# Patient Record
Sex: Female | Born: 1990 | Race: White | Hispanic: No | Smoking: Never smoker
Health system: Southern US, Community
[De-identification: ages and names within clinical notes are randomized; demographics above are authoritative.]

## PROBLEM LIST (undated history)

## (undated) ENCOUNTER — Emergency Department (HOSPITAL_COMMUNITY): Admission: EM | Payer: Medicaid Other | Source: Home / Self Care

## (undated) DIAGNOSIS — T22022A Burn of unspecified degree of left elbow, initial encounter: Secondary | ICD-10-CM

## (undated) DIAGNOSIS — R112 Nausea with vomiting, unspecified: Secondary | ICD-10-CM

## (undated) DIAGNOSIS — G43909 Migraine, unspecified, not intractable, without status migrainosus: Secondary | ICD-10-CM

## (undated) DIAGNOSIS — IMO0001 Reserved for inherently not codable concepts without codable children: Secondary | ICD-10-CM

## (undated) DIAGNOSIS — M26629 Arthralgia of temporomandibular joint, unspecified side: Secondary | ICD-10-CM

## (undated) DIAGNOSIS — Z8614 Personal history of Methicillin resistant Staphylococcus aureus infection: Secondary | ICD-10-CM

## (undated) DIAGNOSIS — D509 Iron deficiency anemia, unspecified: Secondary | ICD-10-CM

## (undated) DIAGNOSIS — Z9889 Other specified postprocedural states: Secondary | ICD-10-CM

## (undated) DIAGNOSIS — J302 Other seasonal allergic rhinitis: Secondary | ICD-10-CM

## (undated) DIAGNOSIS — K115 Sialolithiasis: Secondary | ICD-10-CM

## (undated) HISTORY — PX: TONSILLECTOMY AND ADENOIDECTOMY: SHX28

## (undated) HISTORY — DX: Other seasonal allergic rhinitis: J30.2

---
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
## 2011-03-14 LAB — POCT I-STAT, CHEM 8
Calcium, Ion: 1.16 mmol/L (ref 1.12–1.32)
Glucose, Bld: 102 mg/dL — ABNORMAL HIGH (ref 70–99)
Potassium: 3.3 mEq/L — ABNORMAL LOW (ref 3.5–5.1)
Sodium: 138 mEq/L (ref 135–145)

## 2011-03-15 LAB — STREP A DNA PROBE

## 2011-10-28 HISTORY — PX: WISDOM TOOTH EXTRACTION: SHX21

## 2011-11-04 ENCOUNTER — Encounter (INDEPENDENT_AMBULATORY_CARE_PROVIDER_SITE_OTHER): Payer: Self-pay | Admitting: General Surgery

## 2011-11-05 ENCOUNTER — Ambulatory Visit (INDEPENDENT_AMBULATORY_CARE_PROVIDER_SITE_OTHER): Payer: PRIVATE HEALTH INSURANCE | Admitting: General Surgery

## 2011-11-05 ENCOUNTER — Encounter (INDEPENDENT_AMBULATORY_CARE_PROVIDER_SITE_OTHER): Payer: Self-pay | Admitting: General Surgery

## 2011-11-05 VITALS — BP 101/63 | HR 84 | Temp 98.9°F | Resp 18 | Ht 67.0 in | Wt 160.4 lb

## 2011-11-05 DIAGNOSIS — L723 Sebaceous cyst: Secondary | ICD-10-CM

## 2011-11-05 NOTE — Patient Instructions (Signed)
Stop taking the antibiotic  Epidermal Cyst An epidermal cyst is usually a small, painless lump under the skin. Cysts often occur on the face, neck, stomach, chest, or genitals. The cyst may be filled with a bad smelling paste. Do not pop your cyst. Popping the cyst can cause pain and puffiness (swelling). HOME CARE   Only take medicines as told by your doctor.   Take your medicine (antibiotics) as told. Finish it even if you start to feel better.  GET HELP RIGHT AWAY IF:  Your cyst is tender, red, or puffy.   You are not getting better, or you are getting worse.   You have any questions or concerns.  MAKE SURE YOU:  Understand these instructions.   Will watch your condition.   Will get help right away if you are not doing well or get worse.  Document Released: 01/20/2005 Document Revised: 08/25/2011 Document Reviewed: 06/21/2011 [facility] Patient Information 2012 [place], [place].

## 2011-11-05 NOTE — Progress Notes (Signed)
Chief Complaint  Patient presents with  . Cyst    HPI Sydney [name] is a 20 y.o. female.   HPI  20 year old Caucasian female referred by her dermatologist for evaluation of a left lower quadrant abdominal wall cyst. The patient states that it initially presented about a year ago as a very small hard bump underneath the skin. About a month ago, it increased in size to the size of a grape. it became red, tender, and inflamed. She saw her dermatologist on 11 October where a small incision and drainage was attempted. Apparently it was around 4 cm in size at that time. Very little was expressed after incision and drainage. The patient was placed on doxycycline.  The patient comes in today without any complaints. She denies any pain in the area. She denies any drainage or redness from the incision. She denies any other lumps or bumps. She denies any fevers or chills. She denies any night sweats or weight change. There is no family history of cancer.  Past Medical History  Diagnosis Date  . Asthma   . Seasonal allergies   . Eczema     Past Surgical History  Procedure Date  . Tonsillectomy     No family history on file.  Social History History  Substance Use Topics  . Smoking status: Never Smoker   . Smokeless tobacco: Not on file  . Alcohol Use: No    No Known Allergies  Current Outpatient Prescriptions  Medication Sig Dispense Refill  . ampicillin (PRINCIPEN) 500 MG capsule Take 500 mg by mouth 4 (four) times daily.        . clindamycin-benzoyl peroxide (BENZACLIN) gel Apply topically 2 (two) times daily.        . montelukast (SINGULAIR) 10 MG tablet Take 10 mg by mouth at bedtime.        . TRETINOIN EX Apply topically.          Review of Systems Review of Systems  Constitutional: Negative for fever, chills, fatigue and unexpected weight change.  HENT: Positive for sore throat. Negative for hearing loss, congestion, trouble swallowing and voice change.   Eyes: Negative  for visual disturbance.  Respiratory: Negative for cough, wheezing and stridor.   Cardiovascular: Negative for chest pain, palpitations and leg swelling.  Gastrointestinal: Negative for nausea, vomiting, abdominal pain, diarrhea, constipation, blood in stool, abdominal distention and anal bleeding.  Genitourinary: Negative for hematuria, vaginal bleeding and difficulty urinating.  Musculoskeletal: Negative for arthralgias.  Skin: Negative for rash and wound.  Neurological: Negative for seizures, syncope and headaches.  Hematological: Negative for adenopathy. Does not bruise/bleed easily.  Psychiatric/Behavioral: Negative for confusion.    Blood pressure 101/63, pulse 84, temperature 98.9 F (37.2 C), temperature source Temporal, resp. rate 18, height 5\' 7"  (1.702 m), weight 160 lb 6.4 oz (72.757 kg).  Physical Exam Physical Exam  Vitals reviewed. Constitutional: She is oriented to person, place, and time. She appears well-developed and well-nourished. No distress.  HENT:  Head: Normocephalic and atraumatic.  Eyes: Conjunctivae are normal. No scleral icterus.  Neck: Normal range of motion. Neck supple. No tracheal deviation present. No thyromegaly present.  Cardiovascular: Normal rate, regular rhythm and normal heart sounds.   Pulmonary/Chest: Effort normal and breath sounds normal. No stridor. No respiratory distress. She has no wheezes.  Abdominal: Soft. Bowel sounds are normal. She exhibits no distension and no mass. There is no tenderness.         Left lower quad abd wall -  about 2 in above ASIS. Well healed 0.5cm incision. No overlying cellulitis, fluctuance. About 1-2 mm of subcu tissue thickness under incision.  No pore.  Musculoskeletal: Normal range of motion. She exhibits no edema.  Lymphadenopathy:    She has no cervical adenopathy.       Right: No inguinal and no supraclavicular adenopathy present.       Left: No inguinal and no supraclavicular adenopathy present.    Neurological: She is alert and oriented to person, place, and time. She exhibits normal muscle tone.  Skin: Skin is warm and dry.  Psychiatric: She has a normal mood and affect. Her behavior is normal. Judgment and thought content normal.    Data Reviewed Dr Nino Parsley note from 10/07/11  Assessment    Left lower abdominal wall sebaceous cyst    Plan    Stop taking the antibiotic  I think this most likely was a small sebaceous cyst. There are no signs of infection currently.  We discussed sebaceous cyst. The patient was given educational material. We discussed observation versus operative excision. We discussed the risk and benefits of excision. We also discussed the pros and cons of observation. Since it is very small and not bothering the patient, I recommended observation area. If the area becomes red or swollen again, I advised the patient to contact our office.  Followup p.r.n.  Mary Sella. Andrey Campanile, MD, FACS General, Bariatric, & Minimally Invasive Surgery [facility] Surgery, [place]        [facility] M 11/05/2011, 4:44 PM

## 2012-02-18 ENCOUNTER — Encounter (INDEPENDENT_AMBULATORY_CARE_PROVIDER_SITE_OTHER): Payer: PRIVATE HEALTH INSURANCE | Admitting: General Surgery

## 2012-04-07 ENCOUNTER — Encounter (INDEPENDENT_AMBULATORY_CARE_PROVIDER_SITE_OTHER): Payer: Self-pay | Admitting: General Surgery

## 2012-04-07 ENCOUNTER — Ambulatory Visit (INDEPENDENT_AMBULATORY_CARE_PROVIDER_SITE_OTHER): Payer: PRIVATE HEALTH INSURANCE | Admitting: General Surgery

## 2012-04-07 VITALS — BP 124/80 | HR 84 | Resp 16 | Ht 67.0 in | Wt 162.0 lb

## 2012-04-07 DIAGNOSIS — L723 Sebaceous cyst: Secondary | ICD-10-CM | POA: Insufficient documentation

## 2012-04-07 NOTE — Progress Notes (Signed)
Patient ID: Sydney [name], female   DOB: [birth year], 21 y.o.   MRN: [id number]  Chief Complaint  Patient presents with  . Cyst    LLQ abdomen    HPI Sydney [name] is a 21 y.o. female.   HPI 21 year old female comes in for followup regarding her sebaceous cyst on her left lower quadrant abdominal wall. I initially met her on November 05, 2011. At that time it was not symptomatic and it had been previously incised and drained. She states that the area is still present and it seemed slightly larger and it fills harder to her. She denies any fevers or chills. She denies any weight loss. She denies any other subcutaneous masses. She states that it's very tender if it's touched or press on. She is interested in surgical excision Past Medical History  Diagnosis Date  . Asthma   . Seasonal allergies   . Eczema     Past Surgical History  Procedure Date  . Tonsillectomy     No family history on file.  Social History History  Substance Use Topics  . Smoking status: Never Smoker   . Smokeless tobacco: Not on file  . Alcohol Use: No    No Known Allergies  Current Outpatient Prescriptions  Medication Sig Dispense Refill  . ampicillin (PRINCIPEN) 500 MG capsule Take 500 mg by mouth 4 (four) times daily.        . clindamycin-benzoyl peroxide (BENZACLIN) gel Apply topically 2 (two) times daily.        . montelukast (SINGULAIR) 10 MG tablet Take 10 mg by mouth at bedtime.        . TRETINOIN EX Apply topically.          Review of Systems Review of Systems  Constitutional: Negative for fever, chills, appetite change and unexpected weight change.  HENT: Negative for hearing loss and nosebleeds.   Eyes: Negative for photophobia and visual disturbance.  Respiratory: Negative for shortness of breath and wheezing.   Cardiovascular: Negative for chest pain and palpitations.  Gastrointestinal: Negative.   Genitourinary: Negative for dysuria, hematuria, difficulty urinating and menstrual  problem.  Musculoskeletal: Negative.   Neurological: Negative.   Hematological: Negative.   Psychiatric/Behavioral: Negative.     Blood pressure 124/80, pulse 84, resp. rate 16, height 5\' 7"  (1.702 m), weight 162 lb (73.483 kg).  Physical Exam Physical Exam  Vitals reviewed. Constitutional: She is oriented to person, place, and time. She appears well-developed and well-nourished. No distress.  HENT:  Head: Normocephalic and atraumatic.  Right Ear: External ear normal.  Left Ear: External ear normal.  Eyes: Conjunctivae are normal. No scleral icterus.  Neck: Normal range of motion. Neck supple. No tracheal deviation present. No thyromegaly present.  Cardiovascular: Normal rate, regular rhythm and normal heart sounds.   Pulmonary/Chest: Effort normal and breath sounds normal. No respiratory distress. She has no wheezes.  Abdominal: Soft. Bowel sounds are normal. She exhibits no distension. There is no tenderness.         About 2 inches above left ASIS, small scar about 1cm long, just superior to that is a hard well circumscribed nodule in the subcu tissue about the size of large pea. No cellulitis. No induration. No fluctuance.   Musculoskeletal: Normal range of motion. She exhibits no edema and no tenderness.  Neurological: She is alert and oriented to person, place, and time.  Skin: Skin is warm and dry. She is not diaphoretic.  Psychiatric: She has a normal mood and  affect. Her behavior is normal. Judgment and thought content normal.    Data Reviewed My last office note  Assessment    Left lower abdominal wall sebaceous cyst    Plan    This feels like a chronic sebaceous cyst. We discussed ongoing observation versus surgical excision. She is in the surgical excision. We discussed the risks and benefits of surgery including but not limited to bleeding, infection, scarring, recurrence, cosmesis concerns, anesthesia risks, hematoma formation, and the typical postoperative  recovery course.  We will schedule her for surgery to excise the subcutaneous nodule as well as her old incision just inferior to it.  Mary Sella. Andrey Campanile, MD, FACS General, Bariatric, & Minimally Invasive Surgery [facility] Surgery, [place]        [facility] M 04/07/2012, 7:24 PM

## 2012-04-25 ENCOUNTER — Other Ambulatory Visit (INDEPENDENT_AMBULATORY_CARE_PROVIDER_SITE_OTHER): Payer: Self-pay | Admitting: General Surgery

## 2012-04-25 DIAGNOSIS — L723 Sebaceous cyst: Secondary | ICD-10-CM

## 2012-04-25 HISTORY — PX: DERMOID CYST  EXCISION: SHX1452

## 2012-04-26 ENCOUNTER — Telehealth (INDEPENDENT_AMBULATORY_CARE_PROVIDER_SITE_OTHER): Payer: Self-pay | Admitting: General Surgery

## 2012-04-26 NOTE — Telephone Encounter (Signed)
Left message for patient to call back and ask for me 

## 2012-04-26 NOTE — Telephone Encounter (Signed)
Message copied by Liliana Cline on Wed Apr 26, 2012  2:22 PM ------      Message from: Andrey Campanile, ERIC M      Created: Wed Apr 26, 2012  1:49 PM       pls call pt with path report - benign cyst

## 2012-05-23 ENCOUNTER — Encounter (INDEPENDENT_AMBULATORY_CARE_PROVIDER_SITE_OTHER): Payer: Self-pay | Admitting: General Surgery

## 2012-05-23 ENCOUNTER — Ambulatory Visit (INDEPENDENT_AMBULATORY_CARE_PROVIDER_SITE_OTHER): Payer: PRIVATE HEALTH INSURANCE | Admitting: General Surgery

## 2012-05-23 VITALS — BP 104/78 | HR 80 | Temp 98.9°F | Resp 14 | Ht 67.0 in | Wt 158.2 lb

## 2012-05-23 DIAGNOSIS — Z09 Encounter for follow-up examination after completed treatment for conditions other than malignant neoplasm: Secondary | ICD-10-CM

## 2012-05-23 NOTE — Progress Notes (Signed)
Chief complaint: Postop  Procedure: Excision of left lower quadrant abdominal wall subcutaneous mass April 25, 2012  History of Present Ilness: 21 year old Caucasian female comes in today for her postoperative appointment. She states that she did well after surgery. She states that she did have some soreness and tenderness in the area for about a week after surgery. She denies any fevers or chills. She denies any ongoing pain in the area. She denies any problems with her incision. She reports a good energy level.  Physical Exam: BP 104/78  Pulse 80  Temp(Src) 98.9 F (37.2 C) (Temporal)  Resp 14  Ht 5\' 7"  (1.702 m)  Wt 158 lb 3.2 oz (71.759 kg)  BMI 24.78 kg/m2 Alert, nad abd-soft, nontender, well healed LLQ wall incision. No cellulitis. No seroma. No hematoma  Pathology: Benign epidermoid inclusion cyst  Assessment and Plan: Status post excision of left lower quadrant abdominal wall epidermoid inclusion cyst  We discussed her pathology report. She was given a copy of it. I released her to full activities. I did remind her to keep the scar covered with sunscreen while in the direct sunlight over the next few months. Followup p.r.n.  Mary Sella. Andrey Campanile, MD, FACS General, Bariatric, & Minimally Invasive Surgery [facility] Surgery, [place]

## 2012-05-23 NOTE — Patient Instructions (Signed)
Keep sunscreen over incision for next 5 months when area exposed to sunlight

## 2013-06-20 ENCOUNTER — Ambulatory Visit (INDEPENDENT_AMBULATORY_CARE_PROVIDER_SITE_OTHER): Payer: PRIVATE HEALTH INSURANCE | Admitting: General Surgery

## 2013-06-20 ENCOUNTER — Encounter (INDEPENDENT_AMBULATORY_CARE_PROVIDER_SITE_OTHER): Payer: Self-pay | Admitting: General Surgery

## 2013-06-20 VITALS — BP 108/68 | HR 72 | Temp 98.2°F | Resp 15 | Ht 67.0 in | Wt 167.4 lb

## 2013-06-20 DIAGNOSIS — D179 Benign lipomatous neoplasm, unspecified: Secondary | ICD-10-CM

## 2013-06-20 NOTE — Patient Instructions (Signed)
Call our office when you decide how you would like to proceed. As for [employer]. I would like to get an ultrasound the area to confirm the diagnosis. We will schedule this when you call the office  Lipoma A lipoma is a noncancerous (benign) tumor composed of fat cells. They are usually found under the skin (subcutaneous). A lipoma may occur in any tissue of the body that contains fat. Common areas for lipomas to appear include the back, shoulders, buttocks, and thighs. Lipomas are a very common soft tissue growth. They are soft and grow slowly. Most problems caused by a lipoma depend on where it is growing. DIAGNOSIS  A lipoma can be diagnosed with a physical exam. These tumors rarely become cancerous, but radiographic studies can help determine this for certain. Studies used may include:  Computerized X-ray scans (CT or CAT scan).  Computerized magnetic scans (MRI). TREATMENT  Small lipomas that are not causing problems may be watched. If a lipoma continues to enlarge or causes problems, removal is often the best treatment. Lipomas can also be removed to improve appearance. Surgery is done to remove the fatty cells and the surrounding capsule. Most often, this is done with medicine that numbs the area (local anesthetic). The removed tissue is examined under a microscope to make sure it is not cancerous. Keep all follow-up appointments with your caregiver. SEEK MEDICAL CARE IF:   The lipoma becomes larger or hard.  The lipoma becomes painful, red, or increasingly swollen. These could be signs of infection or a more serious condition. Document Released: 12/03/2002 Document Revised: 03/06/2012 Document Reviewed: 05/15/2010 [facility] Patient Information 2014 [place], [place].

## 2013-06-20 NOTE — Progress Notes (Signed)
Subjective:     Patient ID: Sydney [name], female   DOB: Feb 09, 1991, 22 y.o.   MRN: [id number]  HPI 22 year old Caucasian female comes in with complaints of a right lower back lump. I excised a left lower quadrant abdominal wall sebaceous cyst about a year ago. She states back in April this year she had some pain in her right lower back. She then noticed a lump in the area and made. She saw a physician who thought it might be a muscle strain. The area persisted causing intermittent pain. She saw another physician who diagnosed it as a sebaceous cyst and she came back to see me. She denies any weight loss, night sweats, redness or drainage from the area. Denies any trauma to the area. She states that it feels like it is on her bone. It causes her more discomfort when she stands for prolonged period of time. She denies any numbness or tingling down her lower extremities. She denies any lower extremity weakness. PMHx, PSHx, SOCHx, FAMHx, ALL reviewed and unchanged   Review of Systems 8 point review of systems is performed and all systems are negative except for what is mentioned in the history of present illness    Objective:   Physical Exam  Skin:      BP 108/68  Pulse 72  Temp(Src) 98.2 F (36.8 C) (Temporal)  Resp 15  Ht 5\' 7"  (1.702 m)  Wt 167 lb 6.4 oz (75.932 kg)  BMI 26.21 kg/m2  Gen: alert, NAD, non-toxic appearing Pupils: equal, no scleral icterus Pulm: Lungs clear to auscultation, symmetric chest rise CV: regular rate and rhythm Abd: soft, nondistended.  Ext: no edema, no calf tenderness, strength symmetric Skin: no rash, no jaundice Back: Right lower back - lateral well circumscribed deep soft tissue mass, mobile, about size of large grape. No overlying skin lesion     Assessment:     Right lower back subcutaneous mass     Plan:     On exam this is most consistent with a lipoma. I do not think it is a sebaceous cyst. It does not feel like a muscular tendon either.  We discussed all these as potential diagnoses. However it is most consistent with a lipoma  We discussed the etiology and management of lipomas. The patient was given educational material. We discussed that the majority of lipomas are benign although on a rare occasion it can be malignant.   We discussed observation versus surgical excision. We discussed the risks and benefits of surgery including but not limited to bleeding, infection, injury to surrounding structures, scarring, cosmetic concerns, blood clot formation, anesthesia issues, possible recurrence, and the typical postoperative course.   I did recommend an ultrasound area just to confirm a diagnosis.  The patient and her mother would like to think about it overnight. However they're leaning toward proceeding with intervention with ultrasound and ultimate surgical excision. 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 I explained that we should be able to accommodate her wishes  They were instructed to call the office in the next day or so with how they would like to proceed  Mary Sella. Andrey Campanile, MD, FACS General, Bariatric, & Minimally Invasive Surgery [facility] Surgery, [place]

## 2013-06-26 ENCOUNTER — Other Ambulatory Visit (INDEPENDENT_AMBULATORY_CARE_PROVIDER_SITE_OTHER): Payer: Self-pay

## 2013-06-26 ENCOUNTER — Telehealth (INDEPENDENT_AMBULATORY_CARE_PROVIDER_SITE_OTHER): Payer: Self-pay | Admitting: General Surgery

## 2013-06-26 DIAGNOSIS — R222 Localized swelling, mass and lump, trunk: Secondary | ICD-10-CM

## 2013-06-26 DIAGNOSIS — D171 Benign lipomatous neoplasm of skin and subcutaneous tissue of trunk: Secondary | ICD-10-CM

## 2013-06-26 NOTE — Telephone Encounter (Signed)
Any day but 07/12/13 or 07/13/13

## 2013-06-26 NOTE — Telephone Encounter (Signed)
Patient called back and states she does want to go ahead and schedule this ultrasound. Order placed and given to our referral coordinator. Patient can have this done anytime but 06/12/13 and 06/13/2013. Would like a call back at [phone].

## 2013-06-26 NOTE — Telephone Encounter (Signed)
Called patient to let her know per Dr Tawana Scale office note it did look like the patient needed an ultrasound prior to surgery. I called patient to make her aware. Her phone was breaking up and she stated she would call back.

## 2013-06-26 NOTE — Telephone Encounter (Signed)
Message copied by Liliana Cline on Tue Jun 26, 2013  2:47 PM ------      Message from: Marin Shutter      Created: Tue Jun 26, 2013  1:53 PM      Regarding: Dr. Andrey Campanile      Contact: [phone]       Pt says sx scheduling told her she needs an [place] of her back before sx.  Is that correct, and who would schedule that?  Thx ------

## 2013-06-27 ENCOUNTER — Telehealth (INDEPENDENT_AMBULATORY_CARE_PROVIDER_SITE_OTHER): Payer: Self-pay | Admitting: General Surgery

## 2013-06-27 NOTE — Telephone Encounter (Signed)
Left message for patient to call office for  appt day  at [address],  07/02/13 at 3pm for US pelvis

## 2013-07-02 ENCOUNTER — Ambulatory Visit
Admission: RE | Admit: 2013-07-02 | Discharge: 2013-07-02 | Disposition: A | Discharge status: Home / Self Care | Payer: PRIVATE HEALTH INSURANCE | Source: Ambulatory Visit | Attending: General Surgery | Admitting: General Surgery

## 2013-07-02 DIAGNOSIS — R222 Localized swelling, mass and lump, trunk: Secondary | ICD-10-CM

## 2013-07-03 ENCOUNTER — Telehealth (INDEPENDENT_AMBULATORY_CARE_PROVIDER_SITE_OTHER): Payer: Self-pay | Admitting: General Surgery

## 2013-07-03 NOTE — Telephone Encounter (Signed)
Posting sheet submitted for [facility],. Will fill out orders in AM clinic

## 2013-07-03 NOTE — Telephone Encounter (Signed)
Patient made aware ultrasound did show this area looks like a lipoma. She wants to proceed with removal. Dr Andrey Campanile- please send orders to scheduling.

## 2013-07-03 NOTE — Telephone Encounter (Signed)
Message copied by Liliana Cline on Tue Jul 03, 2013  9:55 AM ------      Message from: Andrey Campanile, ERIC M      Created: Tue Jul 03, 2013  8:34 AM       [place] just shows subcutaneous fat tissue which is what I would suspect given location and depth of lump - area still consistent with a lipoma. Options are to observe or proceed with removal of lump (lipoma) in OR ------

## 2013-07-06 ENCOUNTER — Telehealth (INDEPENDENT_AMBULATORY_CARE_PROVIDER_SITE_OTHER): Payer: Self-pay | Admitting: General Surgery

## 2013-07-06 NOTE — Telephone Encounter (Signed)
Please write [facility] orders sx scheduled for 8/7

## 2013-07-27 HISTORY — PX: LIPOMA EXCISION: SHX5283

## 2013-08-02 ENCOUNTER — Other Ambulatory Visit (INDEPENDENT_AMBULATORY_CARE_PROVIDER_SITE_OTHER): Payer: Self-pay | Admitting: General Surgery

## 2013-08-02 DIAGNOSIS — D1739 Benign lipomatous neoplasm of skin and subcutaneous tissue of other sites: Secondary | ICD-10-CM

## 2013-08-06 ENCOUNTER — Telehealth (INDEPENDENT_AMBULATORY_CARE_PROVIDER_SITE_OTHER): Payer: Self-pay | Admitting: General Surgery

## 2013-08-06 NOTE — Telephone Encounter (Signed)
LMOM 08/06/13@10 :10 to give patient her path results...please see below

## 2013-08-06 NOTE — Telephone Encounter (Signed)
Message copied by June Leap on Mon Aug 06, 2013 10:10 AM ------      Message from: Andrey Campanile, ERIC M      Created: Fri Aug 03, 2013  7:38 PM       Call pt with report - benign lipoma ------

## 2013-08-06 NOTE — Telephone Encounter (Signed)
Per attached note pt advised per Dr Andrey Campanile path result is benign lipoma.

## 2013-08-07 ENCOUNTER — Other Ambulatory Visit (INDEPENDENT_AMBULATORY_CARE_PROVIDER_SITE_OTHER): Payer: Self-pay | Admitting: *Deleted

## 2013-08-07 MED ORDER — OXYCODONE-ACETAMINOPHEN 5-325 MG PO TABS: 1.0000 | ORAL_TABLET | ORAL | Status: DC | PRN

## 2013-08-15 ENCOUNTER — Telehealth (INDEPENDENT_AMBULATORY_CARE_PROVIDER_SITE_OTHER): Payer: Self-pay | Admitting: General Surgery

## 2013-08-15 NOTE — Telephone Encounter (Signed)
LMOM @10 :07 asking patient if she can come in at 3:15 today 8/20 per EW

## 2013-08-15 NOTE — Telephone Encounter (Signed)
Patient cannot come in today due to school schedule. She gets out of class at 3 but it takes 45 minutes to get here. I do not see any other openings. Please call back to schedule.

## 2013-08-15 NOTE — Telephone Encounter (Signed)
Spoke with patient and made appt for 8/28@4 :00.Marland KitchenMarland Kitchenpatient doing well and is agreeable with POC at this time

## 2013-08-23 ENCOUNTER — Ambulatory Visit (INDEPENDENT_AMBULATORY_CARE_PROVIDER_SITE_OTHER): Payer: PRIVATE HEALTH INSURANCE | Admitting: General Surgery

## 2013-08-23 ENCOUNTER — Encounter (INDEPENDENT_AMBULATORY_CARE_PROVIDER_SITE_OTHER): Payer: Self-pay | Admitting: General Surgery

## 2013-08-23 VITALS — BP 124/82 | HR 88 | Temp 98.8°F | Resp 14 | Ht 67.0 in | Wt 174.2 lb

## 2013-08-23 DIAGNOSIS — Z09 Encounter for follow-up examination after completed treatment for conditions other than malignant neoplasm: Secondary | ICD-10-CM

## 2013-08-23 NOTE — Progress Notes (Signed)
Subjective:     Patient ID: Sydney [name], female   DOB: 03/21/1991, 22 y.o.   MRN: [id number]  HPI 22yo WF s/p excision of right lower back lipoma comes in for f/u. No complaints. No fever/chills. No drainage. no pain  Review of Systems     Objective:   Physical Exam BP 124/82  Pulse 88  Temp(Src) 98.8 F (37.1 C) (Temporal)  Resp 14  Ht 5\' 7"  (1.702 m)  Wt 174 lb 3.2 oz (79.017 kg)  BMI 27.28 kg/m2 Alert, no apparent distress Back as well healed right lower back transverse incision. No cellulitis, induration or fluctuance. No hematoma or seroma    Assessment:     Status post excision of right lower back lipoma     Plan:     Overall I think she is doing well. The incision looks really good. She was given a which showed a lipoma. Followup when necessary  Mary Sella. Andrey Campanile, MD, FACS General, Bariatric, & Minimally Invasive Surgery [facility] Surgery, [place]

## 2013-09-26 DIAGNOSIS — K115 Sialolithiasis: Secondary | ICD-10-CM

## 2013-09-26 HISTORY — DX: Sialolithiasis: K11.5

## 2013-10-18 ENCOUNTER — Encounter (HOSPITAL_BASED_OUTPATIENT_CLINIC_OR_DEPARTMENT_OTHER): Payer: Self-pay | Admitting: *Deleted

## 2013-10-18 DIAGNOSIS — T22022A Burn of unspecified degree of left elbow, initial encounter: Secondary | ICD-10-CM

## 2013-10-18 DIAGNOSIS — IMO0001 Reserved for inherently not codable concepts without codable children: Secondary | ICD-10-CM

## 2013-10-18 HISTORY — DX: Burn of unspecified degree of left elbow, initial encounter: T22.022A

## 2013-10-18 HISTORY — DX: Reserved for inherently not codable concepts without codable children: IMO0001

## 2013-10-23 NOTE — H&P (Signed)
  This is a 22 y/o wd/wn white female who presents with pain in the left floor of the mouth and left submandibular area. This gets worse when eating.  It has become more painful the last several weeks.  There is slight swelling in the left floor of the mouth and no discharge from the left whartons duct.  The treatment plan is to remove the blockage (stone)

## 2013-10-24 ENCOUNTER — Ambulatory Visit (HOSPITAL_BASED_OUTPATIENT_CLINIC_OR_DEPARTMENT_OTHER)
Admission: RE | Admit: 2013-10-24 | Discharge: 2013-10-24 | Disposition: A | Discharge status: Home / Self Care | Payer: No Typology Code available for payment source | Source: Ambulatory Visit | Attending: Oral Surgery | Admitting: Oral Surgery

## 2013-10-24 ENCOUNTER — Encounter (HOSPITAL_BASED_OUTPATIENT_CLINIC_OR_DEPARTMENT_OTHER)
Admission: RE | Disposition: A | Discharge status: Home / Self Care | Payer: Self-pay | Source: Ambulatory Visit | Attending: Oral Surgery

## 2013-10-24 ENCOUNTER — Encounter (HOSPITAL_BASED_OUTPATIENT_CLINIC_OR_DEPARTMENT_OTHER): Payer: No Typology Code available for payment source | Admitting: *Deleted

## 2013-10-24 ENCOUNTER — Encounter (HOSPITAL_BASED_OUTPATIENT_CLINIC_OR_DEPARTMENT_OTHER): Payer: Self-pay | Admitting: *Deleted

## 2013-10-24 ENCOUNTER — Ambulatory Visit (HOSPITAL_BASED_OUTPATIENT_CLINIC_OR_DEPARTMENT_OTHER): Payer: No Typology Code available for payment source | Admitting: *Deleted

## 2013-10-24 DIAGNOSIS — Z01812 Encounter for preprocedural laboratory examination: Secondary | ICD-10-CM | POA: Insufficient documentation

## 2013-10-24 DIAGNOSIS — J309 Allergic rhinitis, unspecified: Secondary | ICD-10-CM | POA: Insufficient documentation

## 2013-10-24 DIAGNOSIS — J45909 Unspecified asthma, uncomplicated: Secondary | ICD-10-CM | POA: Insufficient documentation

## 2013-10-24 DIAGNOSIS — R599 Enlarged lymph nodes, unspecified: Secondary | ICD-10-CM | POA: Insufficient documentation

## 2013-10-24 DIAGNOSIS — Z79899 Other long term (current) drug therapy: Secondary | ICD-10-CM | POA: Insufficient documentation

## 2013-10-24 DIAGNOSIS — K115 Sialolithiasis: Secondary | ICD-10-CM | POA: Insufficient documentation

## 2013-10-24 HISTORY — DX: Migraine, unspecified, not intractable, without status migrainosus: G43.909

## 2013-10-24 HISTORY — PX: SUBLINGUAL SALIVARY CYST EXCISION: SHX2454

## 2013-10-24 HISTORY — DX: Personal history of Methicillin resistant Staphylococcus aureus infection: Z86.14

## 2013-10-24 HISTORY — DX: Other specified postprocedural states: Z98.890

## 2013-10-24 HISTORY — DX: Other specified postprocedural states: R11.2

## 2013-10-24 HISTORY — DX: Reserved for inherently not codable concepts without codable children: IMO0001

## 2013-10-24 HISTORY — DX: Sialolithiasis: K11.5

## 2013-10-24 HISTORY — DX: Burn of unspecified degree of left elbow, initial encounter: T22.022A

## 2013-10-24 HISTORY — DX: Arthralgia of temporomandibular joint, unspecified side: M26.629

## 2013-10-24 SURGERY — EXCISION, CYST, SUBLINGUAL GLAND
Anesthesia: General | Site: Mouth | Laterality: Left | Wound class: Clean Contaminated

## 2013-10-24 MED ORDER — MIDAZOLAM HCL 2 MG/2ML IJ SOLN
INTRAMUSCULAR | Status: AC
  Filled 2013-10-24: qty 2

## 2013-10-24 MED ORDER — HYDROMORPHONE HCL PF 1 MG/ML IJ SOLN
0.2500 mg | INTRAMUSCULAR | Status: DC | PRN
  Administered 2013-10-24 (×4): 0.5 mg via INTRAVENOUS
  Administered 2013-10-24: 0.25 mg via INTRAVENOUS

## 2013-10-24 MED ORDER — LIDOCAINE HCL (CARDIAC) 20 MG/ML IV SOLN
INTRAVENOUS | Status: DC | PRN
  Administered 2013-10-24: 80 mg via INTRAVENOUS

## 2013-10-24 MED ORDER — HYDROMORPHONE HCL PF 1 MG/ML IJ SOLN
INTRAMUSCULAR | Status: AC
  Filled 2013-10-24: qty 1

## 2013-10-24 MED ORDER — PROPOFOL 10 MG/ML IV BOLUS
INTRAVENOUS | Status: DC | PRN
  Administered 2013-10-24: 200 mg via INTRAVENOUS

## 2013-10-24 MED ORDER — PROMETHAZINE HCL 25 MG/ML IJ SOLN
6.2500 mg | INTRAMUSCULAR | Status: DC | PRN
  Administered 2013-10-24: 6.25 mg via INTRAVENOUS

## 2013-10-24 MED ORDER — FENTANYL CITRATE 0.05 MG/ML IJ SOLN: 50.0000 ug | INTRAMUSCULAR | Status: DC | PRN

## 2013-10-24 MED ORDER — HYDROCODONE-ACETAMINOPHEN 7.5-325 MG PO TABS: 1.0000 | ORAL_TABLET | Freq: Four times a day (QID) | ORAL | Status: DC | PRN

## 2013-10-24 MED ORDER — OXYMETAZOLINE HCL 0.05 % NA SOLN
NASAL | Status: DC | PRN
  Administered 2013-10-24 (×2): 2 via NASAL

## 2013-10-24 MED ORDER — SCOPOLAMINE 1 MG/3DAYS TD PT72
MEDICATED_PATCH | TRANSDERMAL | Status: AC
  Filled 2013-10-24: qty 1

## 2013-10-24 MED ORDER — OXYCODONE HCL 5 MG/5ML PO SOLN
5.0000 mg | Freq: Once | ORAL | Status: AC | PRN
  Administered 2013-10-24: 5 mg via ORAL

## 2013-10-24 MED ORDER — OXYCODONE HCL 5 MG PO TABS: 5.0000 mg | ORAL_TABLET | Freq: Once | ORAL | Status: AC | PRN

## 2013-10-24 MED ORDER — MIDAZOLAM HCL 2 MG/ML PO SYRP: 12.0000 mg | ORAL_SOLUTION | Freq: Once | ORAL | Status: DC | PRN

## 2013-10-24 MED ORDER — ONDANSETRON HCL 4 MG/2ML IJ SOLN
INTRAMUSCULAR | Status: DC | PRN
  Administered 2013-10-24: 4 mg via INTRAVENOUS

## 2013-10-24 MED ORDER — MIDAZOLAM HCL 2 MG/2ML IJ SOLN: 1.0000 mg | INTRAMUSCULAR | Status: DC | PRN

## 2013-10-24 MED ORDER — BUPIVACAINE-EPINEPHRINE PF 0.5-1:200000 % IJ SOLN
INTRAMUSCULAR | Status: AC
  Filled 2013-10-24: qty 30

## 2013-10-24 MED ORDER — CEFAZOLIN SODIUM-DEXTROSE 2-3 GM-% IV SOLR
2.0000 g | INTRAVENOUS | Status: AC
  Administered 2013-10-24: 2 g via INTRAVENOUS

## 2013-10-24 MED ORDER — FENTANYL CITRATE 0.05 MG/ML IJ SOLN
INTRAMUSCULAR | Status: AC
  Filled 2013-10-24: qty 4

## 2013-10-24 MED ORDER — DEXAMETHASONE SODIUM PHOSPHATE 4 MG/ML IJ SOLN
INTRAMUSCULAR | Status: DC | PRN
  Administered 2013-10-24: 10 mg via INTRAVENOUS

## 2013-10-24 MED ORDER — SUCCINYLCHOLINE CHLORIDE 20 MG/ML IJ SOLN
INTRAMUSCULAR | Status: DC | PRN
  Administered 2013-10-24: 100 mg via INTRAVENOUS

## 2013-10-24 MED ORDER — LACTATED RINGERS IV SOLN
INTRAVENOUS | Status: DC
  Administered 2013-10-24 (×2): via INTRAVENOUS

## 2013-10-24 MED ORDER — OXYCODONE HCL 5 MG/5ML PO SOLN
ORAL | Status: AC
  Filled 2013-10-24: qty 5

## 2013-10-24 MED ORDER — BACITRACIN-NEOMYCIN-POLYMYXIN 400-5-5000 EX OINT
TOPICAL_OINTMENT | CUTANEOUS | Status: AC
  Filled 2013-10-24: qty 1

## 2013-10-24 MED ORDER — MIDAZOLAM HCL 5 MG/5ML IJ SOLN
INTRAMUSCULAR | Status: DC | PRN
  Administered 2013-10-24: 2 mg via INTRAVENOUS

## 2013-10-24 MED ORDER — SCOPOLAMINE 1 MG/3DAYS TD PT72: 1.0000 | MEDICATED_PATCH | TRANSDERMAL | Status: DC

## 2013-10-24 MED ORDER — SUCCINYLCHOLINE CHLORIDE 20 MG/ML IJ SOLN
INTRAMUSCULAR | Status: AC
  Filled 2013-10-24: qty 2

## 2013-10-24 MED ORDER — HYDROMORPHONE HCL PF 1 MG/ML IJ SOLN
0.5000 mg | INTRAMUSCULAR | Status: AC | PRN
  Administered 2013-10-24: 0.5 mg via INTRAVENOUS
  Administered 2013-10-24 (×3): 0.25 mg via INTRAVENOUS

## 2013-10-24 MED ORDER — ONDANSETRON HCL 4 MG/2ML IJ SOLN: 4.0000 mg | Freq: Once | INTRAMUSCULAR | Status: DC | PRN

## 2013-10-24 MED ORDER — PROPOFOL 10 MG/ML IV EMUL
INTRAVENOUS | Status: AC
  Filled 2013-10-24: qty 100

## 2013-10-24 MED ORDER — SODIUM CHLORIDE 0.9 % IJ SOLN
INTRAMUSCULAR | Status: AC
  Filled 2013-10-24: qty 10

## 2013-10-24 MED ORDER — LIDOCAINE-EPINEPHRINE 2 %-1:100000 IJ SOLN
INTRAMUSCULAR | Status: DC | PRN
  Administered 2013-10-24: 1.5 mL

## 2013-10-24 MED ORDER — FENTANYL CITRATE 0.05 MG/ML IJ SOLN
INTRAMUSCULAR | Status: DC | PRN
  Administered 2013-10-24 (×2): 50 ug via INTRAVENOUS

## 2013-10-24 MED ORDER — LIDOCAINE-EPINEPHRINE 2 %-1:100000 IJ SOLN
INTRAMUSCULAR | Status: AC
  Filled 2013-10-24: qty 3.4

## 2013-10-24 MED ORDER — PROMETHAZINE HCL 25 MG/ML IJ SOLN
INTRAMUSCULAR | Status: AC
  Filled 2013-10-24: qty 1

## 2013-10-24 MED ORDER — SCOPOLAMINE 1 MG/3DAYS TD PT72
1.0000 | MEDICATED_PATCH | Freq: Once | TRANSDERMAL | Status: DC
  Administered 2013-10-24: 1.5 mg via TRANSDERMAL

## 2013-10-24 SURGICAL SUPPLY — 26 items
BLADE SURG 15 STRL LF DISP TIS (BLADE) ×1 IMPLANT
BLADE SURG 15 STRL SS (BLADE) ×1
CANISTER SUCT 1200ML W/VALVE (MISCELLANEOUS) ×2 IMPLANT
CATH ROBINSON RED A/P 10FR (CATHETERS) IMPLANT
COVER MAYO STAND STRL (DRAPES) ×2 IMPLANT
COVER TABLE BACK 60X90 (DRAPES) ×2 IMPLANT
DRAPE U-SHAPE 76X120 STRL (DRAPES) ×2 IMPLANT
GAUZE PACKING IODOFORM 1/4X5 (PACKING) IMPLANT
GLOVE BIO SURGEON STRL SZ 6.5 (GLOVE) ×4 IMPLANT
GLOVE BIO SURGEON STRL SZ7.5 (GLOVE) ×2 IMPLANT
GOWN PREVENTION PLUS XLARGE (GOWN DISPOSABLE) ×2 IMPLANT
GOWN PREVENTION PLUS XXLARGE (GOWN DISPOSABLE) ×2 IMPLANT
NEEDLE DENTAL 27 LONG (NEEDLE) ×2 IMPLANT
NS IRRIG 1000ML POUR BTL (IV SOLUTION) ×2 IMPLANT
PACK BASIN DAY SURGERY FS (CUSTOM PROCEDURE TRAY) ×2 IMPLANT
SUT CHROMIC 3 0 PS 2 (SUTURE) IMPLANT
SUT CHROMIC 4 0 P 3 18 (SUTURE) IMPLANT
SUT SILK 3 0 PS 1 (SUTURE) IMPLANT
SUT VICRYL 4-0 PS2 18IN ABS (SUTURE) IMPLANT
SYR 50ML LL SCALE MARK (SYRINGE) IMPLANT
TOOTHBRUSH ADULT (PERSONAL CARE ITEMS) IMPLANT
TOWEL OR 17X24 6PK STRL BLUE (TOWEL DISPOSABLE) ×4 IMPLANT
TOWEL OR NON WOVEN STRL DISP B (DISPOSABLE) ×2 IMPLANT
TRAY DSU PREP LF (CUSTOM PROCEDURE TRAY) ×2 IMPLANT
TUBE CONNECTING 20X1/4 (TUBING) ×2 IMPLANT
YANKAUER SUCT BULB TIP NO VENT (SUCTIONS) ×2 IMPLANT

## 2013-10-24 NOTE — H&P (Signed)
Sydney [name] is an 22 y.o. female.   Chief Complaint: pain swelling left floor of the mouth HPI: pain and swelling times 1 month  Past Medical History  Diagnosis Date  . Seasonal allergies     nasal congestion and sore throat 10/18/2013  . PONV (postoperative nausea and vomiting)   . Migraine headache   . Asthma     prn inhaler  . Salivary gland stone 09/2013    left  . Burn of left elbow 10/18/2013  . Cut 10/18/2013    right lower leg - no sutures  . History of MRSA infection age 9    thigh  . TMJ tenderness     with opening mouth wide    Past Surgical History  Procedure Laterality Date  . Dermoid cyst  excision Left 04/25/12    abd. wall  . Lipoma excision  07/2013    back  . Tonsillectomy and adenoidectomy    . Wisdom tooth extraction  10/2011    History reviewed. No pertinent family history. Social History:  reports that she has never smoked. She has never used smokeless tobacco. She reports that she does not drink alcohol or use illicit drugs.  Allergies: No Known Allergies  Medications Prior to Admission  Medication Sig Dispense Refill  . fluticasone (FLONASE) 50 MCG/ACT nasal spray Place 2 sprays into the nose daily.      . Levonorgestrel-Ethinyl Estrad (AVIANE PO) Take 1 tablet by mouth daily.       . vitamin B-12 (CYANOCOBALAMIN) 100 MCG tablet Take 100 mcg by mouth daily.      Marland Kitchen albuterol (PROVENTIL HFA;VENTOLIN HFA) 108 (90 BASE) MCG/ACT inhaler Inhale 2 puffs into the lungs every 6 (six) hours as needed for wheezing.        No results found for this or any previous visit (from the past 48 hour(s)). No results found.  Review of Systems  Constitutional: Negative.   HENT: Negative.   Eyes: Negative.   Respiratory: Negative.   Cardiovascular: Negative.   Gastrointestinal: Negative.   Genitourinary: Negative.   Musculoskeletal: Negative.   Skin: Negative.   Neurological: Negative.   Endo/Heme/Allergies: Negative.   Psychiatric/Behavioral:  Negative.   All other systems reviewed and are negative.    Blood pressure 112/75, pulse 72, temperature 98 F (36.7 C), resp. rate 20, height 5\' 7"  (1.702 m), weight 81.103 kg (178 lb 12.8 oz), last menstrual period 09/27/2013, SpO2 98.00%. Physical Exam  Nursing note and vitals reviewed. Constitutional: She is oriented to person, place, and time. She appears well-developed and well-nourished.  HENT:  Head: Normocephalic and atraumatic.  Right Ear: External ear normal.  Left Ear: External ear normal.  Mouth/Throat: Uvula is midline, oropharynx is clear and moist and mucous membranes are normal. No oropharyngeal exudate.    Eyes: Conjunctivae and EOM are normal. Pupils are equal, round, and reactive to light.  Neck: Normal range of motion. Neck supple.  Cardiovascular: Normal rate, regular rhythm, normal heart sounds and intact distal pulses.   Respiratory: Effort normal and breath sounds normal.  GI: Soft. Bowel sounds are normal.  Musculoskeletal: Normal range of motion.  Lymphadenopathy:    She has cervical adenopathy.  Neurological: She is alert and oriented to person, place, and time. She has normal reflexes.  Skin: Skin is warm and dry.  Psychiatric: She has a normal mood and affect. Her behavior is normal. Judgment and thought content normal.     Assessment/Plan Exploration and removal of stone floor of  the mouth left side  Andee Chivers,JOSEPH L 10/24/2013, 8:27 AM

## 2013-10-24 NOTE — Anesthesia Postprocedure Evaluation (Signed)
  Anesthesia Post-op Note  Patient: Sydney [name]  Procedure(s) Performed: Procedure(s): REMOVAL OF SALIVARY GLAND STONE LEFT SIDE (Left)  Patient Location: PACU  Anesthesia Type:General  Level of Consciousness: awake, alert  and oriented  Airway and Oxygen Therapy: Patient Spontanous Breathing and Patient connected to face mask oxygen  Post-op Pain: mild  Post-op Assessment: Post-op Vital signs reviewed  Post-op Vital Signs: Reviewed  Complications: No apparent anesthesia complications

## 2013-10-24 NOTE — Anesthesia Preprocedure Evaluation (Signed)
Anesthesia Evaluation  Patient identified by MRN, date of birth, ID band Patient awake    Reviewed: Allergy & Precautions, H&P , NPO status , Patient's Chart, lab work & pertinent test results  History of Anesthesia Complications (+) PONV  Airway Mallampati: I TM Distance: >3 FB Neck ROM: Full    Dental  (+) Teeth Intact and Dental Advisory Given   Pulmonary asthma ,  breath sounds clear to auscultation        Cardiovascular Rhythm:Regular Rate:Normal     Neuro/Psych    GI/Hepatic   Endo/Other    Renal/GU      Musculoskeletal   Abdominal   Peds  Hematology   Anesthesia Other Findings   Reproductive/Obstetrics                           Anesthesia Physical Anesthesia Plan  ASA: II  Anesthesia Plan: General   Post-op Pain Management:    Induction: Intravenous  Airway Management Planned: Oral ETT  Additional Equipment:   Intra-op Plan:   Post-operative Plan: Extubation in OR  Informed Consent: I have reviewed the patients History and Physical, chart, labs and discussed the procedure including the risks, benefits and alternatives for the proposed anesthesia with the patient or authorized representative who has indicated his/her understanding and acceptance.   Dental advisory given  Plan Discussed with: CRNA, Anesthesiologist and Surgeon  Anesthesia Plan Comments:         Anesthesia Quick Evaluation

## 2013-10-24 NOTE — Transfer of Care (Signed)
Immediate Anesthesia Transfer of Care Note  Patient: Sydney [name]  Procedure(s) Performed: Procedure(s): REMOVAL OF SALIVARY GLAND STONE LEFT SIDE (Left)  Patient Location: PACU  Anesthesia Type:General  Level of Consciousness: awake, alert  and oriented  Airway & Oxygen Therapy: Patient Spontanous Breathing and Patient connected to face mask oxygen  Post-op Assessment: Report given to PACU RN, Post -op Vital signs reviewed and stable and Patient moving all extremities  Post vital signs: Reviewed and stable  Complications: No apparent anesthesia complications

## 2013-10-24 NOTE — H&P (Signed)
No change in medical history.  

## 2013-10-24 NOTE — Op Note (Signed)
The patient was brought to the operating room and placed in the supine position and which remained throughout the whole procedure he was intubated via an oral endotracheal tube as she was unable to be intubated via a nasal endotracheal tube. The face and intraoral area were prepped with Betadine scrub and paint. She was then draped in the usual fashion for an oral surgery procedure. The mouth and throat were suctioned out and a moist open 4 x 4 gauze was placed around the endotracheal tube. 1.5 cc of 2% Xylocaine with 1-100,000 epinephrine was infiltrated into the floor of the mouth. A #15 blade made an incision approximately 3 cm in length in the posterior floor of the mouth. The dissection was then used using a curved hemostat to get to the dark work and submandibular gland. Using extraoral digital pressure an intraoral pressure the stone was located. An attempt was made to manipulate it in a forward direction. The stone appeared to break up so that he could no longer be palpable. Fragments were found in the anterior portion Wharton's duct. A small incision was made in the anterior portion of importance to releasing the fragments. Without being able to palpate any large fragment of stone no further dissection took place. The area was irrigated out. The throat was irrigated and the throat pack was removed. Incision was left open. The patient was then extubated and returned to the recovery room in good condition.

## 2013-10-24 NOTE — Anesthesia Procedure Notes (Signed)
Procedure Name: Intubation Date/Time: 10/24/2013 8:42 AM Performed by: Hinton Dyer Pre-anesthesia Checklist: Patient identified, Emergency Drugs available, Suction available and Patient being monitored Patient Re-evaluated:Patient Re-evaluated prior to inductionOxygen Delivery Method: Circle System Utilized Preoxygenation: Pre-oxygenation with 100% oxygen Intubation Type: IV induction Ventilation: Mask ventilation without difficulty Laryngoscope Size: Miller and 2 Grade View: Grade II Tube type: Oral Tube size: 7.0 mm Number of attempts: 2 (unable to advance #6.5 nasal rae tube after nasal prep; # 7.0 OETT advanced easily under direct visualization) Airway Equipment and Method: stylet and oral airway Placement Confirmation: ETT inserted through vocal cords under direct vision,  positive ETCO2 and breath sounds checked- equal and bilateral Secured at: 21 cm Tube secured with: Tape Dental Injury: Teeth and Oropharynx as per pre-operative assessment

## 2013-10-24 NOTE — Brief Op Note (Signed)
10/24/2013  9:11 AM  PATIENT:  Sydney [name]  22 y.o. female  PRE-OPERATIVE DIAGNOSIS:  Salivary gland stone left side  POST-OPERATIVE DIAGNOSIS:  Salivary gland stone left side  PROCEDURE:  Procedure(s): REMOVAL OF SALIVARY GLAND STONE LEFT SIDE (Left)  SURGEON:  Surgeon(s) and Role:    * Hinton Dyer, DDS - Primary  PHYSICIAN ASSISTANT:   ASSISTANTS: Hadassah Pais, Lequita Halt Arledge exploration floor of the mouth with removal of salivary gland stones   ANESTHESIA:   general  EBL:  Total I/O In: 1000 [I.V.:1000] Out: -   BLOOD ADMINISTERED:none  DRAINS: none   LOCAL MEDICATIONS USED:  XYLOCAINE   SPECIMEN:  Source of Specimen:  Floor of the mouth  DISPOSITION OF SPECIMEN:  PATHOLOGY  COUNTS:  YES  TOURNIQUET:  * No tourniquets in log *  DICTATION: .Dragon Dictation  PLAN OF CARE: Discharge to home after PACU  PATIENT DISPOSITION:  PACU - hemodynamically stable.   Delay start of Pharmacological VTE agent (>24hrs) due to surgical blood loss or risk of bleeding: not applicable

## 2013-10-25 ENCOUNTER — Encounter (HOSPITAL_BASED_OUTPATIENT_CLINIC_OR_DEPARTMENT_OTHER): Payer: Self-pay | Admitting: Oral Surgery

## 2014-11-09 ENCOUNTER — Emergency Department (HOSPITAL_COMMUNITY)
Admission: EM | Admit: 2014-11-09 | Discharge: 2014-11-10 | Disposition: A | Discharge status: Home / Self Care | Payer: BC Managed Care – PPO | Attending: Emergency Medicine | Admitting: Emergency Medicine

## 2014-11-09 ENCOUNTER — Encounter (HOSPITAL_COMMUNITY): Payer: Self-pay | Admitting: Emergency Medicine

## 2014-11-09 DIAGNOSIS — J45909 Unspecified asthma, uncomplicated: Secondary | ICD-10-CM | POA: Insufficient documentation

## 2014-11-09 DIAGNOSIS — R10819 Abdominal tenderness, unspecified site: Secondary | ICD-10-CM | POA: Diagnosis not present

## 2014-11-09 DIAGNOSIS — Z79899 Other long term (current) drug therapy: Secondary | ICD-10-CM | POA: Insufficient documentation

## 2014-11-09 DIAGNOSIS — B9689 Other specified bacterial agents as the cause of diseases classified elsewhere: Secondary | ICD-10-CM

## 2014-11-09 DIAGNOSIS — Z872 Personal history of diseases of the skin and subcutaneous tissue: Secondary | ICD-10-CM | POA: Diagnosis not present

## 2014-11-09 DIAGNOSIS — Z7951 Long term (current) use of inhaled steroids: Secondary | ICD-10-CM | POA: Insufficient documentation

## 2014-11-09 DIAGNOSIS — Z8719 Personal history of other diseases of the digestive system: Secondary | ICD-10-CM | POA: Diagnosis not present

## 2014-11-09 DIAGNOSIS — N739 Female pelvic inflammatory disease, unspecified: Secondary | ICD-10-CM | POA: Diagnosis not present

## 2014-11-09 DIAGNOSIS — R109 Unspecified abdominal pain: Secondary | ICD-10-CM | POA: Diagnosis present

## 2014-11-09 DIAGNOSIS — N760 Acute vaginitis: Secondary | ICD-10-CM | POA: Diagnosis not present

## 2014-11-09 DIAGNOSIS — N730 Acute parametritis and pelvic cellulitis: Secondary | ICD-10-CM

## 2014-11-09 LAB — POC URINE PREG, ED: Preg Test, Ur: NEGATIVE

## 2014-11-09 LAB — CBC WITH DIFFERENTIAL/PLATELET
BASOS ABS: 0 10*3/uL (ref 0.0–0.1)
BASOS PCT: 0 % (ref 0–1)
EOS ABS: 0.2 10*3/uL (ref 0.0–0.7)
Eosinophils Relative: 2 % (ref 0–5)
HEMATOCRIT: 35.1 % — AB (ref 36.0–46.0)
HEMOGLOBIN: 12 g/dL (ref 12.0–15.0)
Lymphocytes Relative: 35 % (ref 12–46)
Lymphs Abs: 2.7 10*3/uL (ref 0.7–4.0)
MCH: 30 pg (ref 26.0–34.0)
MCHC: 34.2 g/dL (ref 30.0–36.0)
MCV: 87.8 fL (ref 78.0–100.0)
MONO ABS: 0.5 10*3/uL (ref 0.1–1.0)
Monocytes Relative: 6 % (ref 3–12)
Neutro Abs: 4.4 10*3/uL (ref 1.7–7.7)
Neutrophils Relative %: 57 % (ref 43–77)
Platelets: 266 10*3/uL (ref 150–400)
RBC: 4 MIL/uL (ref 3.87–5.11)
RDW: 12.3 % (ref 11.5–15.5)
WBC: 7.8 10*3/uL (ref 4.0–10.5)

## 2014-11-09 LAB — BASIC METABOLIC PANEL
ANION GAP: 14 (ref 5–15)
BUN: 8 mg/dL (ref 6–23)
CALCIUM: 9.4 mg/dL (ref 8.4–10.5)
CO2: 23 mEq/L (ref 19–32)
CREATININE: 0.85 mg/dL (ref 0.50–1.10)
Chloride: 109 mEq/L (ref 96–112)
Glucose, Bld: 158 mg/dL — ABNORMAL HIGH (ref 70–99)
Potassium: 4.5 mEq/L (ref 3.7–5.3)
Sodium: 146 mEq/L (ref 137–147)

## 2014-11-09 LAB — URINALYSIS, ROUTINE W REFLEX MICROSCOPIC
Bilirubin Urine: NEGATIVE
GLUCOSE, UA: NEGATIVE mg/dL
HGB URINE DIPSTICK: NEGATIVE
KETONES UR: NEGATIVE mg/dL
Leukocytes, UA: NEGATIVE
Nitrite: NEGATIVE
Protein, ur: NEGATIVE mg/dL
Specific Gravity, Urine: 1.01 (ref 1.005–1.030)
Urobilinogen, UA: 0.2 mg/dL (ref 0.0–1.0)
pH: 7 (ref 5.0–8.0)

## 2014-11-09 LAB — LIPASE, BLOOD: Lipase: 58 U/L (ref 11–59)

## 2014-11-09 NOTE — ED Notes (Signed)
Pt arrived to the ED with a complaint of flank and lower back pain.  Pt states she has had symptoms for four days.  Pt states that she has urinary frequency and a foul odor in her urine.

## 2014-11-10 LAB — WET PREP, GENITAL: TRICH WET PREP: NONE SEEN

## 2014-11-10 MED ORDER — AZITHROMYCIN 250 MG PO TABS
2000.0000 mg | ORAL_TABLET | Freq: Once | ORAL | Status: AC
  Administered 2014-11-10: 2000 mg via ORAL
  Filled 2014-11-10: qty 8

## 2014-11-10 MED ORDER — IBUPROFEN 800 MG PO TABS
800.0000 mg | ORAL_TABLET | Freq: Once | ORAL | Status: AC
  Administered 2014-11-10: 800 mg via ORAL
  Filled 2014-11-10: qty 1

## 2014-11-10 MED ORDER — CEFTRIAXONE SODIUM 250 MG IJ SOLR: 250.0000 mg | Freq: Once | INTRAMUSCULAR | Status: DC

## 2014-11-10 MED ORDER — METRONIDAZOLE 500 MG PO TABS
500.0000 mg | ORAL_TABLET | Freq: Once | ORAL | Status: AC
  Administered 2014-11-10: 500 mg via ORAL
  Filled 2014-11-10: qty 1

## 2014-11-10 MED ORDER — DOXYCYCLINE HYCLATE 100 MG PO TABS: 100.0000 mg | ORAL_TABLET | Freq: Two times a day (BID) | ORAL | Status: DC

## 2014-11-10 MED ORDER — DOXYCYCLINE HYCLATE 100 MG PO TABS
100.0000 mg | ORAL_TABLET | Freq: Once | ORAL | Status: AC
  Administered 2014-11-10: 100 mg via ORAL
  Filled 2014-11-10: qty 1

## 2014-11-10 MED ORDER — METRONIDAZOLE 500 MG PO TABS: 500.0000 mg | ORAL_TABLET | Freq: Two times a day (BID) | ORAL | Status: DC

## 2014-11-10 NOTE — ED Provider Notes (Signed)
CSN: [id number]     Arrival date & time 11/09/14  2012 History   First MD Initiated Contact with Patient 11/10/14 [phone]     Chief Complaint  Patient presents with  . Flank Pain     (Consider location/radiation/quality/duration/timing/severity/associated sxs/prior Treatment) HPI  Sydney [name] is a 23 y.o. female with past medical history of asthma, migraines coming in with back pain. Patient states it's been in her bilateral flanks for the last 4 days. She's had intermittent subjective fevers as well. She states she's had to urinate more frequently and at times has not been able to make it to the restroom. She denies any saddle anesthesia. There's been no dysuria or hematuria. She's noted increase in vaginal discharge. He's had no abnormal bleeding. Patient denies any symptoms of cancer, IV drug abuse.  Nothing has made her symptoms better or worse. Patient has no further complaints.  10 Systems reviewed and are negative for acute change except as noted in the HPI.     Past Medical History  Diagnosis Date  . Seasonal allergies     nasal congestion and sore throat 10/18/2013  . PONV (postoperative nausea and vomiting)   . Migraine headache   . Asthma     prn inhaler  . Salivary gland stone 09/2013    left  . Burn of left elbow 10/18/2013  . Cut 10/18/2013    right lower leg - no sutures  . History of MRSA infection age 64    thigh  . TMJ tenderness     with opening mouth wide   Past Surgical History  Procedure Laterality Date  . Dermoid cyst  excision Left 04/25/12    abd. wall  . Lipoma excision  07/2013    back  . Tonsillectomy and adenoidectomy    . Wisdom tooth extraction  10/2011  . Sublingual salivary cyst excision Left 10/24/2013    Procedure: REMOVAL OF SALIVARY GLAND STONE LEFT SIDE;  Surgeon: Ceasar Mons, DDS;  Location: [facility];  Service: Oral Surgery;  Laterality: Left;   History reviewed. No pertinent family history. History  Substance  Use Topics  . Smoking status: Never Smoker   . Smokeless tobacco: Never Used  . Alcohol Use: No   OB History    No data available     Review of Systems    Allergies  Apple and Cherry  Home Medications   Prior to Admission medications   Medication Sig Start Date End Date Taking? Authorizing Provider  albuterol (PROVENTIL HFA;VENTOLIN HFA) 108 (90 BASE) MCG/ACT inhaler Inhale 2 puffs into the lungs every 6 (six) hours as needed for wheezing.    Historical Provider, MD  fluticasone (FLONASE) 50 MCG/ACT nasal spray Place 2 sprays into the nose daily.    Historical Provider, MD  HYDROcodone-acetaminophen (NORCO) 7.5-325 MG per tablet Take 1 tablet by mouth every 6 (six) hours as needed for pain. 10/24/13   Ceasar Mons, DDS  Levonorgestrel-Ethinyl Estrad (AVIANE PO) Take 1 tablet by mouth daily.     Historical Provider, MD  vitamin B-12 (CYANOCOBALAMIN) 100 MCG tablet Take 100 mcg by mouth daily.    Historical Provider, MD   BP 125/72 mmHg  Pulse 64  Temp(Src) 99.5 F (37.5 C) (Oral)  Resp 16  SpO2 100%  LMP 10/27/2014 Physical Exam  Constitutional: She is oriented to person, place, and time. She appears well-developed and well-nourished. No distress.  HENT:  Head: Normocephalic and atraumatic.  Nose: Nose normal.  Mouth/Throat: Oropharynx is clear and moist. No oropharyngeal exudate.  Eyes: Conjunctivae and EOM are normal. Pupils are equal, round, and reactive to light. No scleral icterus.  Neck: Normal range of motion. Neck supple. No JVD present. No tracheal deviation present. No thyromegaly present.  Cardiovascular: Normal rate, regular rhythm and normal heart sounds.  Exam reveals no gallop and no friction rub.   No murmur heard. Pulmonary/Chest: Effort normal and breath sounds normal. No respiratory distress. She has no wheezes. She exhibits no tenderness.  Abdominal: Soft. Bowel sounds are normal. She exhibits no distension and no mass. There is tenderness. There is  no rebound and no guarding.  Bilateral lower quadrant tenderness to palpation.  Genitourinary:  Pelvic exam deferred to PA at patient's request.she does not want a female provider.  Musculoskeletal: Normal range of motion. She exhibits no edema or tenderness.  Lymphadenopathy:    She has no cervical adenopathy.  Neurological: She is alert and oriented to person, place, and time. No cranial nerve deficit. She exhibits normal muscle tone.  5 out of 5 strength times first ovaries. Normal sensation 4 extremities. Normal cerebellar testing and gait testing.  Skin: Skin is warm and dry. No rash noted. No erythema. No pallor.  Nursing note and vitals reviewed.   ED Course  Procedures (including critical care time) Labs Review Labs Reviewed  WET PREP, GENITAL - Abnormal; Notable for the following:    Yeast Wet Prep HPF POC RARE (*)    Clue Cells Wet Prep HPF POC MANY (*)    WBC, Wet Prep HPF POC FEW (*)    All other components within normal limits  CBC WITH DIFFERENTIAL - Abnormal; Notable for the following:    HCT 35.1 (*)    All other components within normal limits  BASIC METABOLIC PANEL - Abnormal; Notable for the following:    Glucose, Bld 158 (*)    All other components within normal limits  GC/CHLAMYDIA PROBE AMP  LIPASE, BLOOD  URINALYSIS, ROUTINE W REFLEX MICROSCOPIC  POC URINE PREG, ED    Imaging Review No results found.   EKG Interpretation None      MDM   Final diagnoses:  None    Patient since emergency department out of concern for abdominal and back pain. She's also had increased vaginal discharge. I have concern for sexual transmitted infection. Currently waiting pelvic exam.  Pelvic exam unremarkable per PA evaluation.  Will treat for PID and BV seen on wet prep due to the patients history of inc vag DC and now back pain with frequency.  I do not believe patient has an emergent cause of her back pain.  She denies red flag symptoms of malignancy, IV drug abuse  at any time, saddle anesthesia, or significant numbness/weakness in the lower extremities.  PAtient can always feel the sensation of when she has to urinate, but does not always make it to the bathroom.  She was advised to have close follow up with a PCP within 3 days to ensure everything is getting better.  She has a family h/o autoimmune disease and she was told she may have MS causing her intermittent neuro symptoms.  Return precautions given, vitals remain within her normal limits and she is safe for DC.    Everlene Balls, MD 11/10/14 [phone]

## 2014-11-10 NOTE — ED Provider Notes (Signed)
Pelvic exam performed by this provider. Patient preferred female provider to perform exam.   Pelvic exam: Negative swelling, erythema, inflammation, lesions, sores, deformities identified to the external genitalia. Negative swelling, erythema, inflammation, lesions, sores, deformities, masses or lesions noted to the vaginal canal. Negative blood in the vaginal vault. Small amount of thick white discharge noted on exam with mild odor. Cervix identified with negative friability. Negative CMT. Discomfort upon palpation to suprapubic and left adnexal region. Negative right adnexal tenderness. Exam chaperoned with nurse  Jamse Mead, PA-C 11/10/14 [id number]  Everlene Balls, MD 11/10/14 1416

## 2014-11-10 NOTE — Discharge Instructions (Signed)
Bacterial Vaginosis Sydney [name], you were seen today for back pain and abdominal pain. Take antibiotics as prescribed. Come back to emergency department immediately if your back pain worsens or if you develop new weakness or numbness. Follow-up with a primary care physician for continued evaluation within 3 days. Bacterial vaginosis is a vaginal infection that occurs when the normal balance of bacteria in the vagina is disrupted. It results from an overgrowth of certain bacteria. This is the most common vaginal infection in women of childbearing age. Treatment is important to prevent complications, especially in pregnant women, as it can cause a premature delivery. CAUSES  Bacterial vaginosis is caused by an increase in harmful bacteria that are normally present in smaller amounts in the vagina. Several different kinds of bacteria can cause bacterial vaginosis. However, the reason that the condition develops is not fully understood. RISK FACTORS Certain activities or behaviors can put you at an increased risk of developing bacterial vaginosis, including:  Having a new sex partner or multiple sex partners.  Douching.  Using an intrauterine device (IUD) for contraception. Women do not get bacterial vaginosis from toilet seats, bedding, swimming pools, or contact with objects around them. SIGNS AND SYMPTOMS  Some women with bacterial vaginosis have no signs or symptoms. Common symptoms include:  Grey vaginal discharge.  A fishlike odor with discharge, especially after sexual intercourse.  Itching or burning of the vagina and vulva.  Burning or pain with urination. DIAGNOSIS  Your health care provider will take a medical history and examine the vagina for signs of bacterial vaginosis. A sample of vaginal fluid may be taken. Your health care provider will look at this sample under a microscope to check for bacteria and abnormal cells. A vaginal pH test may also be done.  TREATMENT  Bacterial  vaginosis may be treated with antibiotic medicines. These may be given in the form of a pill or a vaginal cream. A second round of antibiotics may be prescribed if the condition comes back after treatment.  HOME CARE INSTRUCTIONS   Only take over-the-counter or prescription medicines as directed by your health care provider.  If antibiotic medicine was prescribed, take it as directed. Make sure you finish it even if you start to feel better.  Do not have sex until treatment is completed.  Tell all sexual partners that you have a vaginal infection. They should see their health care provider and be treated if they have problems, such as a mild rash or itching.  Practice safe sex by using condoms and only having one sex partner. SEEK MEDICAL CARE IF:   Your symptoms are not improving after 3 days of treatment.  You have increased discharge or pain.  You have a fever. MAKE SURE YOU:   Understand these instructions.  Will watch your condition.  Will get help right away if you are not doing well or get worse. FOR MORE INFORMATION  Centers for Disease Control and Prevention, Division of STD Prevention: AppraiserFraud[employer] American Sexual Health Association (ASHA): www.ashastd.org  Document Released: 12/13/2005 Document Revised: 10/03/2013 Document Reviewed: 07/25/2013 [facility] Patient Information 2015 [place], [place]. This information is not intended to replace advice given to you by your health care provider. Make sure you discuss any questions you have with your health care provider.

## 2014-11-11 LAB — GC/CHLAMYDIA PROBE AMP
CT Probe RNA: NEGATIVE
GC Probe RNA: NEGATIVE

## 2015-02-11 LAB — OB RESULTS CONSOLE RPR: RPR: NONREACTIVE

## 2015-02-11 LAB — OB RESULTS CONSOLE ANTIBODY SCREEN: ANTIBODY SCREEN: NEGATIVE

## 2015-02-11 LAB — OB RESULTS CONSOLE HEPATITIS B SURFACE ANTIGEN: HEP B S AG: NEGATIVE

## 2015-02-11 LAB — OB RESULTS CONSOLE RUBELLA ANTIBODY, IGM: RUBELLA: IMMUNE

## 2015-02-11 LAB — OB RESULTS CONSOLE ABO/RH: RH TYPE: POSITIVE

## 2015-02-11 LAB — OB RESULTS CONSOLE GC/CHLAMYDIA
Chlamydia: NEGATIVE
GC PROBE AMP, GENITAL: NEGATIVE

## 2015-02-11 LAB — OB RESULTS CONSOLE HIV ANTIBODY (ROUTINE TESTING): HIV: NONREACTIVE

## 2015-08-20 LAB — OB RESULTS CONSOLE GBS: GBS: NEGATIVE

## 2015-08-28 ENCOUNTER — Inpatient Hospital Stay (HOSPITAL_COMMUNITY)
Admission: AD | Admit: 2015-08-28 | Discharge: 2015-08-29 | Disposition: A | Discharge status: Home / Self Care | Payer: 59 | Source: Ambulatory Visit | Attending: Obstetrics | Admitting: Obstetrics

## 2015-08-28 DIAGNOSIS — O4290 Premature rupture of membranes, unspecified as to length of time between rupture and onset of labor, unspecified weeks of gestation: Secondary | ICD-10-CM

## 2015-08-28 DIAGNOSIS — Z3493 Encounter for supervision of normal pregnancy, unspecified, third trimester: Secondary | ICD-10-CM | POA: Insufficient documentation

## 2015-08-29 ENCOUNTER — Inpatient Hospital Stay (HOSPITAL_COMMUNITY): Payer: 59

## 2015-08-29 ENCOUNTER — Encounter (HOSPITAL_COMMUNITY): Payer: Self-pay | Admitting: *Deleted

## 2015-08-29 DIAGNOSIS — Z3493 Encounter for supervision of normal pregnancy, unspecified, third trimester: Secondary | ICD-10-CM | POA: Diagnosis present

## 2015-08-29 LAB — POCT FERN TEST: POCT FERN TEST: NEGATIVE

## 2015-08-29 LAB — AMNISURE RUPTURE OF MEMBRANE (ROM) NOT AT ARMC: AMNISURE: NEGATIVE

## 2015-08-29 MED ORDER — NON FORMULARY: 150.0000 mg | Freq: Once | Status: DC

## 2015-08-29 MED ORDER — FAMOTIDINE 20 MG PO TABS
20.0000 mg | ORAL_TABLET | Freq: Once | ORAL | Status: AC
  Administered 2015-08-29: 20 mg via ORAL
  Filled 2015-08-29: qty 1

## 2015-08-29 NOTE — Progress Notes (Signed)
U/S results called to [facility], orders received to discharge home

## 2015-08-29 NOTE — Progress Notes (Signed)
Rolitta Dawson CNM notified of pt's arrival and complaints, orders received.

## 2015-08-29 NOTE — Discharge Instructions (Signed)

## 2015-08-29 NOTE — MAU Note (Signed)
Pt presents to MAU with complaints of leakage of fluid that started approximately 45 mins ago. Pt reports lower abdominal pain that comes and goes

## 2015-09-02 ENCOUNTER — Inpatient Hospital Stay (HOSPITAL_COMMUNITY)
Admission: AD | Admit: 2015-09-02 | Discharge: 2015-09-06 | DRG: 765 | Disposition: A | Discharge status: Home / Self Care | Payer: 59 | Source: Ambulatory Visit | Attending: Obstetrics & Gynecology | Admitting: Obstetrics & Gynecology

## 2015-09-02 ENCOUNTER — Encounter (HOSPITAL_COMMUNITY): Payer: Self-pay | Admitting: *Deleted

## 2015-09-02 DIAGNOSIS — O0903 Supervision of pregnancy with history of infertility, third trimester: Secondary | ICD-10-CM | POA: Diagnosis not present

## 2015-09-02 DIAGNOSIS — O1092 Unspecified pre-existing hypertension complicating childbirth: Secondary | ICD-10-CM | POA: Diagnosis present

## 2015-09-02 DIAGNOSIS — Z8349 Family history of other endocrine, nutritional and metabolic diseases: Secondary | ICD-10-CM

## 2015-09-02 DIAGNOSIS — O2603 Excessive weight gain in pregnancy, third trimester: Secondary | ICD-10-CM | POA: Diagnosis present

## 2015-09-02 DIAGNOSIS — Z833 Family history of diabetes mellitus: Secondary | ICD-10-CM | POA: Diagnosis not present

## 2015-09-02 DIAGNOSIS — R112 Nausea with vomiting, unspecified: Secondary | ICD-10-CM | POA: Diagnosis present

## 2015-09-02 DIAGNOSIS — Z841 Family history of disorders of kidney and ureter: Secondary | ICD-10-CM | POA: Diagnosis not present

## 2015-09-02 DIAGNOSIS — O99354 Diseases of the nervous system complicating childbirth: Secondary | ICD-10-CM | POA: Diagnosis present

## 2015-09-02 DIAGNOSIS — R609 Edema, unspecified: Secondary | ICD-10-CM | POA: Diagnosis present

## 2015-09-02 DIAGNOSIS — G43909 Migraine, unspecified, not intractable, without status migrainosus: Secondary | ICD-10-CM | POA: Diagnosis present

## 2015-09-02 DIAGNOSIS — O9952 Diseases of the respiratory system complicating childbirth: Secondary | ICD-10-CM | POA: Diagnosis present

## 2015-09-02 DIAGNOSIS — Z809 Family history of malignant neoplasm, unspecified: Secondary | ICD-10-CM | POA: Diagnosis not present

## 2015-09-02 DIAGNOSIS — O9902 Anemia complicating childbirth: Secondary | ICD-10-CM | POA: Diagnosis present

## 2015-09-02 DIAGNOSIS — Z828 Family history of other disabilities and chronic diseases leading to disablement, not elsewhere classified: Secondary | ICD-10-CM | POA: Diagnosis not present

## 2015-09-02 DIAGNOSIS — O1493 Unspecified pre-eclampsia, third trimester: Secondary | ICD-10-CM | POA: Diagnosis present

## 2015-09-02 DIAGNOSIS — Z810 Family history of intellectual disabilities: Secondary | ICD-10-CM

## 2015-09-02 DIAGNOSIS — O133 Gestational [pregnancy-induced] hypertension without significant proteinuria, third trimester: Secondary | ICD-10-CM | POA: Diagnosis present

## 2015-09-02 DIAGNOSIS — Z811 Family history of alcohol abuse and dependence: Secondary | ICD-10-CM | POA: Diagnosis not present

## 2015-09-02 DIAGNOSIS — D509 Iron deficiency anemia, unspecified: Secondary | ICD-10-CM | POA: Diagnosis present

## 2015-09-02 DIAGNOSIS — Z8614 Personal history of Methicillin resistant Staphylococcus aureus infection: Secondary | ICD-10-CM

## 2015-09-02 DIAGNOSIS — Z821 Family history of blindness and visual loss: Secondary | ICD-10-CM

## 2015-09-02 DIAGNOSIS — Z823 Family history of stroke: Secondary | ICD-10-CM

## 2015-09-02 DIAGNOSIS — Z813 Family history of other psychoactive substance abuse and dependence: Secondary | ICD-10-CM | POA: Diagnosis not present

## 2015-09-02 DIAGNOSIS — Z822 Family history of deafness and hearing loss: Secondary | ICD-10-CM | POA: Diagnosis not present

## 2015-09-02 DIAGNOSIS — O119 Pre-existing hypertension with pre-eclampsia, unspecified trimester: Secondary | ICD-10-CM | POA: Diagnosis present

## 2015-09-02 DIAGNOSIS — J45909 Unspecified asthma, uncomplicated: Secondary | ICD-10-CM | POA: Diagnosis present

## 2015-09-02 DIAGNOSIS — Z8249 Family history of ischemic heart disease and other diseases of the circulatory system: Secondary | ICD-10-CM

## 2015-09-02 DIAGNOSIS — O139 Gestational [pregnancy-induced] hypertension without significant proteinuria, unspecified trimester: Secondary | ICD-10-CM | POA: Diagnosis present

## 2015-09-02 DIAGNOSIS — Z825 Family history of asthma and other chronic lower respiratory diseases: Secondary | ICD-10-CM | POA: Diagnosis not present

## 2015-09-02 DIAGNOSIS — D62 Acute posthemorrhagic anemia: Secondary | ICD-10-CM | POA: Diagnosis not present

## 2015-09-02 DIAGNOSIS — Z3A37 37 weeks gestation of pregnancy: Secondary | ICD-10-CM | POA: Diagnosis present

## 2015-09-02 HISTORY — DX: Iron deficiency anemia, unspecified: D50.9

## 2015-09-02 LAB — COMPREHENSIVE METABOLIC PANEL
ALT: 16 U/L (ref 14–54)
ALT: 17 U/L (ref 14–54)
AST: 25 U/L (ref 15–41)
AST: 34 U/L (ref 15–41)
Albumin: 2.6 g/dL — ABNORMAL LOW (ref 3.5–5.0)
Albumin: 2.8 g/dL — ABNORMAL LOW (ref 3.5–5.0)
Alkaline Phosphatase: 182 U/L — ABNORMAL HIGH (ref 38–126)
Alkaline Phosphatase: 198 U/L — ABNORMAL HIGH (ref 38–126)
Anion gap: 9 (ref 5–15)
Anion gap: 11 (ref 5–15)
BUN: 9 mg/dL (ref 6–20)
BUN: 9 mg/dL (ref 6–20)
CO2: 22 mmol/L (ref 22–32)
CO2: 20 mmol/L — ABNORMAL LOW (ref 22–32)
Calcium: 8.4 mg/dL — ABNORMAL LOW (ref 8.9–10.3)
Calcium: 8.4 mg/dL — ABNORMAL LOW (ref 8.9–10.3)
Chloride: 104 mmol/L (ref 101–111)
Chloride: 106 mmol/L (ref 101–111)
Creatinine, Ser: 0.66 mg/dL (ref 0.44–1.00)
Creatinine, Ser: 0.64 mg/dL (ref 0.44–1.00)
GFR calc Af Amer: >60 mL/min (ref >60)
GFR calc Af Amer: >60 mL/min (ref >60)
GFR calc non Af Amer: >60 mL/min (ref >60)
GFR calc non Af Amer: >60 mL/min (ref >60)
Glucose, Bld: 113 mg/dL — ABNORMAL HIGH (ref 65–99)
Glucose, Bld: 67 mg/dL (ref 65–99)
Potassium: 3.9 mmol/L (ref 3.5–5.1)
Potassium: 4.4 mmol/L (ref 3.5–5.1)
Sodium: 135 mmol/L (ref 135–145)
Sodium: 137 mmol/L (ref 135–145)
Total Bilirubin: 0.5 mg/dL (ref 0.3–1.2)
Total Bilirubin: 0.9 mg/dL (ref 0.3–1.2)
Total Protein: 5.7 g/dL — ABNORMAL LOW (ref 6.5–8.1)
Total Protein: 6 g/dL — ABNORMAL LOW (ref 6.5–8.1)

## 2015-09-02 LAB — CBC
HCT: 28.3 % — ABNORMAL LOW (ref 36.0–46.0)
HCT: 30.2 % — ABNORMAL LOW (ref 36.0–46.0)
Hemoglobin: 9 g/dL — ABNORMAL LOW (ref 12.0–15.0)
Hemoglobin: 9.5 g/dL — ABNORMAL LOW (ref 12.0–15.0)
MCH: 25.7 pg — ABNORMAL LOW (ref 26.0–34.0)
MCH: 25.4 pg — ABNORMAL LOW (ref 26.0–34.0)
MCHC: 31.8 g/dL (ref 30.0–36.0)
MCHC: 31.5 g/dL (ref 30.0–36.0)
MCV: 80.9 fL (ref 78.0–100.0)
MCV: 80.7 fL (ref 78.0–100.0)
Platelets: 174 10*3/uL (ref 150–400)
Platelets: 188 10*3/uL (ref 150–400)
RBC: 3.5 MIL/uL — ABNORMAL LOW (ref 3.87–5.11)
RBC: 3.74 MIL/uL — ABNORMAL LOW (ref 3.87–5.11)
RDW: 15.5 % (ref 11.5–15.5)
RDW: 15.6 % — ABNORMAL HIGH (ref 11.5–15.5)
WBC: 9.1 10*3/uL (ref 4.0–10.5)
WBC: 11 10*3/uL — ABNORMAL HIGH (ref 4.0–10.5)

## 2015-09-02 LAB — PROTEIN / CREATININE RATIO, URINE
Creatinine, Urine: 286 mg/dL
Protein Creatinine Ratio: 1.98 mg/mg{Cre} — ABNORMAL HIGH (ref 0.00–0.15)
Total Protein, Urine: 565 mg/dL

## 2015-09-02 LAB — URIC ACID: Uric Acid, Serum: 4.7 mg/dL (ref 2.3–6.6)

## 2015-09-02 LAB — ABO/RH: ABO/RH(D): B POS

## 2015-09-02 MED ORDER — OXYTOCIN 40 UNITS IN LACTATED RINGERS INFUSION - SIMPLE MED
1.0000 m[IU]/min | INTRAVENOUS | Status: DC
  Administered 2015-09-03: 2 m[IU]/min via INTRAVENOUS
  Filled 2015-09-02: qty 1000

## 2015-09-02 MED ORDER — MAGNESIUM SULFATE BOLUS VIA INFUSION
4.0000 g | Freq: Once | INTRAVENOUS | Status: AC
  Administered 2015-09-02: 4 g via INTRAVENOUS
  Filled 2015-09-02: qty 500

## 2015-09-02 MED ORDER — OXYCODONE-ACETAMINOPHEN 5-325 MG PO TABS: 1.0000 | ORAL_TABLET | ORAL | Status: DC | PRN

## 2015-09-02 MED ORDER — CITRIC ACID-SODIUM CITRATE 334-500 MG/5ML PO SOLN
30.0000 mL | ORAL | Status: DC | PRN
  Administered 2015-09-03: 30 mL via ORAL
  Filled 2015-09-02: qty 15

## 2015-09-02 MED ORDER — LABETALOL HCL 100 MG PO TABS
100.0000 mg | ORAL_TABLET | Freq: Three times a day (TID) | ORAL | Status: DC
  Administered 2015-09-02 – 2015-09-03 (×4): 100 mg via ORAL
  Filled 2015-09-02 (×7): qty 1

## 2015-09-02 MED ORDER — ACETAMINOPHEN 325 MG PO TABS: 650.0000 mg | ORAL_TABLET | ORAL | Status: DC | PRN

## 2015-09-02 MED ORDER — LIDOCAINE HCL (PF) 1 % IJ SOLN: 30.0000 mL | INTRAMUSCULAR | Status: DC | PRN

## 2015-09-02 MED ORDER — ONDANSETRON HCL 4 MG/2ML IJ SOLN
4.0000 mg | Freq: Four times a day (QID) | INTRAMUSCULAR | Status: DC | PRN
  Administered 2015-09-03: 4 mg via INTRAVENOUS

## 2015-09-02 MED ORDER — NALBUPHINE HCL 10 MG/ML IJ SOLN
5.0000 mg | INTRAMUSCULAR | Status: DC | PRN
  Administered 2015-09-03: 5 mg via INTRAVENOUS
  Filled 2015-09-02 (×2): qty 0.5

## 2015-09-02 MED ORDER — LACTATED RINGERS IV SOLN
500.0000 mL | INTRAVENOUS | Status: DC | PRN
  Administered 2015-09-03: 250 mL via INTRAVENOUS

## 2015-09-02 MED ORDER — MAGNESIUM SULFATE 50 % IJ SOLN
2.0000 g/h | INTRAVENOUS | Status: DC
  Administered 2015-09-02 – 2015-09-04 (×3): 2 g/h via INTRAVENOUS
  Filled 2015-09-02 (×3): qty 80

## 2015-09-02 MED ORDER — MISOPROSTOL 25 MCG QUARTER TABLET
25.0000 ug | ORAL_TABLET | ORAL | Status: AC | PRN
  Administered 2015-09-02 – 2015-09-03 (×3): 25 ug via VAGINAL
  Filled 2015-09-02 (×3): qty 0.25

## 2015-09-02 MED ORDER — OXYCODONE-ACETAMINOPHEN 5-325 MG PO TABS: 2.0000 | ORAL_TABLET | ORAL | Status: DC | PRN

## 2015-09-02 MED ORDER — LACTATED RINGERS IV SOLN
INTRAVENOUS | Status: DC
  Administered 2015-09-02 – 2015-09-03 (×2): via INTRAVENOUS
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
## 2015-09-03 ENCOUNTER — Encounter (HOSPITAL_COMMUNITY): Payer: Self-pay | Admitting: Obstetrics and Gynecology

## 2015-09-03 ENCOUNTER — Inpatient Hospital Stay (HOSPITAL_COMMUNITY): Payer: 59 | Admitting: Anesthesiology

## 2015-09-03 ENCOUNTER — Encounter (HOSPITAL_COMMUNITY)
Admission: AD | Disposition: A | Discharge status: Home / Self Care | Payer: Self-pay | Source: Ambulatory Visit | Attending: Obstetrics & Gynecology

## 2015-09-03 DIAGNOSIS — D509 Iron deficiency anemia, unspecified: Secondary | ICD-10-CM

## 2015-09-03 HISTORY — DX: Iron deficiency anemia, unspecified: D50.9

## 2015-09-03 LAB — PREPARE RBC (CROSSMATCH)

## 2015-09-03 LAB — COMPREHENSIVE METABOLIC PANEL
ALT: 15 U/L (ref 14–54)
AST: 24 U/L (ref 15–41)
Albumin: 2.6 g/dL — ABNORMAL LOW (ref 3.5–5.0)
Alkaline Phosphatase: 188 U/L — ABNORMAL HIGH (ref 38–126)
Anion gap: 9 (ref 5–15)
BUN: 8 mg/dL (ref 6–20)
CO2: 22 mmol/L (ref 22–32)
Calcium: 7.5 mg/dL — ABNORMAL LOW (ref 8.9–10.3)
Chloride: 105 mmol/L (ref 101–111)
Creatinine, Ser: 0.66 mg/dL (ref 0.44–1.00)
GFR calc Af Amer: >60 mL/min (ref >60)
GFR calc non Af Amer: >60 mL/min (ref >60)
Glucose, Bld: 90 mg/dL (ref 65–99)
Potassium: 4.2 mmol/L (ref 3.5–5.1)
Sodium: 136 mmol/L (ref 135–145)
Total Bilirubin: 0.6 mg/dL (ref 0.3–1.2)
Total Protein: 5.4 g/dL — ABNORMAL LOW (ref 6.5–8.1)

## 2015-09-03 LAB — CBC
HCT: 28 % — ABNORMAL LOW (ref 36.0–46.0)
HEMATOCRIT: 27.4 % — AB (ref 36.0–46.0)
Hemoglobin: 8.8 g/dL — ABNORMAL LOW (ref 12.0–15.0)
Hemoglobin: 8.6 g/dL — ABNORMAL LOW (ref 12.0–15.0)
MCH: 25.4 pg — ABNORMAL LOW (ref 26.0–34.0)
MCH: 25.7 pg — ABNORMAL LOW (ref 26.0–34.0)
MCHC: 31.4 g/dL (ref 30.0–36.0)
MCHC: 31.4 g/dL (ref 30.0–36.0)
MCV: 80.9 fL (ref 78.0–100.0)
MCV: 81.8 fL (ref 78.0–100.0)
PLATELETS: 150 10*3/uL (ref 150–400)
Platelets: 161 10*3/uL (ref 150–400)
RBC: 3.46 MIL/uL — ABNORMAL LOW (ref 3.87–5.11)
RBC: 3.35 MIL/uL — ABNORMAL LOW (ref 3.87–5.11)
RDW: 15.8 % — ABNORMAL HIGH (ref 11.5–15.5)
RDW: 16 % — AB (ref 11.5–15.5)
WBC: 11.3 10*3/uL — ABNORMAL HIGH (ref 4.0–10.5)
WBC: 10.9 10*3/uL — ABNORMAL HIGH (ref 4.0–10.5)

## 2015-09-03 LAB — URIC ACID: Uric Acid, Serum: 5 mg/dL (ref 2.3–6.6)

## 2015-09-03 LAB — RPR: RPR Ser Ql: NONREACTIVE

## 2015-09-03 LAB — MAGNESIUM: Magnesium: 5 mg/dL — ABNORMAL HIGH (ref 1.7–2.4)

## 2015-09-03 SURGERY — Surgical Case
Anesthesia: Epidural

## 2015-09-03 MED ORDER — BUPIVACAINE HCL (PF) 0.25 % IJ SOLN
INTRAMUSCULAR | Status: AC
  Filled 2015-09-03: qty 20

## 2015-09-03 MED ORDER — DIPHENHYDRAMINE HCL 50 MG/ML IJ SOLN
12.5000 mg | INTRAMUSCULAR | Status: DC | PRN
Start: 2015-09-03 — End: 2015-09-03

## 2015-09-03 MED ORDER — BUPIVACAINE LIPOSOME 1.3 % IJ SUSP
20.0000 mL | Freq: Once | INTRAMUSCULAR | Status: DC
  Filled 2015-09-03: qty 20

## 2015-09-03 MED ORDER — ONDANSETRON HCL 4 MG/2ML IJ SOLN
INTRAMUSCULAR | Status: AC
  Filled 2015-09-03: qty 2

## 2015-09-03 MED ORDER — KETOROLAC TROMETHAMINE 30 MG/ML IJ SOLN
INTRAMUSCULAR | Status: AC
  Filled 2015-09-03: qty 1

## 2015-09-03 MED ORDER — HYDROMORPHONE HCL 1 MG/ML IJ SOLN
0.2500 mg | INTRAMUSCULAR | Status: DC | PRN
  Administered 2015-09-03 (×2): 0.5 mg via INTRAVENOUS

## 2015-09-03 MED ORDER — SODIUM BICARBONATE 8.4 % IV SOLN
INTRAVENOUS | Status: DC | PRN
  Administered 2015-09-03 (×4): 5 mL via EPIDURAL

## 2015-09-03 MED ORDER — KETOROLAC TROMETHAMINE 30 MG/ML IJ SOLN
30.0000 mg | Freq: Four times a day (QID) | INTRAMUSCULAR | Status: DC | PRN
  Filled 2015-09-03: qty 1

## 2015-09-03 MED ORDER — BUPIVACAINE HCL (PF) 0.25 % IJ SOLN
INTRAMUSCULAR | Status: AC
  Filled 2015-09-03: qty 10

## 2015-09-03 MED ORDER — SODIUM CHLORIDE 0.9 % IJ SOLN
INTRAMUSCULAR | Status: DC | PRN
  Administered 2015-09-03: 20 mL

## 2015-09-03 MED ORDER — MENTHOL 3 MG MT LOZG
1.0000 | LOZENGE | OROMUCOSAL | Status: DC | PRN
  Filled 2015-09-03: qty 9

## 2015-09-03 MED ORDER — OXYTOCIN 10 UNIT/ML IJ SOLN
40.0000 [IU] | INTRAVENOUS | Status: DC | PRN
  Administered 2015-09-03: 40 [IU] via INTRAVENOUS

## 2015-09-03 MED ORDER — HYDROMORPHONE HCL 1 MG/ML IJ SOLN
INTRAMUSCULAR | Status: AC
  Filled 2015-09-03: qty 1

## 2015-09-03 MED ORDER — PHENYLEPHRINE 40 MCG/ML (10ML) SYRINGE FOR IV PUSH (FOR BLOOD PRESSURE SUPPORT)
PREFILLED_SYRINGE | INTRAVENOUS | Status: AC
  Filled 2015-09-03: qty 20

## 2015-09-03 MED ORDER — CARBOPROST TROMETHAMINE 250 MCG/ML IM SOLN
INTRAMUSCULAR | Status: DC | PRN
  Administered 2015-09-03: 250 ug

## 2015-09-03 MED ORDER — FENTANYL CITRATE (PF) 100 MCG/2ML IJ SOLN: 25.0000 ug | INTRAMUSCULAR | Status: DC | PRN

## 2015-09-03 MED ORDER — FENTANYL CITRATE (PF) 250 MCG/5ML IJ SOLN
INTRAMUSCULAR | Status: AC
Start: 2015-09-03 — End: 2015-09-03
  Filled 2015-09-03: qty 25

## 2015-09-03 MED ORDER — NALOXONE HCL 0.4 MG/ML IJ SOLN: 0.4000 mg | INTRAMUSCULAR | Status: DC | PRN

## 2015-09-03 MED ORDER — ONDANSETRON HCL 4 MG/2ML IJ SOLN: 4.0000 mg | Freq: Once | INTRAMUSCULAR | Status: DC | PRN

## 2015-09-03 MED ORDER — BUPIVACAINE HCL (PF) 0.25 % IJ SOLN
INTRAMUSCULAR | Status: DC | PRN
  Administered 2015-09-03: 20 mL

## 2015-09-03 MED ORDER — DEXAMETHASONE SODIUM PHOSPHATE 4 MG/ML IJ SOLN
INTRAMUSCULAR | Status: AC
  Filled 2015-09-03: qty 1

## 2015-09-03 MED ORDER — ACETAMINOPHEN 325 MG PO TABS
650.0000 mg | ORAL_TABLET | ORAL | Status: DC | PRN
  Administered 2015-09-04: 650 mg via ORAL
  Filled 2015-09-03: qty 2

## 2015-09-03 MED ORDER — DIPHENHYDRAMINE HCL 25 MG PO CAPS: 25.0000 mg | ORAL_CAPSULE | Freq: Four times a day (QID) | ORAL | Status: DC | PRN

## 2015-09-03 MED ORDER — NALBUPHINE HCL 10 MG/ML IJ SOLN
5.0000 mg | Freq: Once | INTRAMUSCULAR | Status: DC | PRN
  Filled 2015-09-03: qty 0.5

## 2015-09-03 MED ORDER — OXYTOCIN 10 UNIT/ML IJ SOLN
INTRAMUSCULAR | Status: AC
  Filled 2015-09-03: qty 4

## 2015-09-03 MED ORDER — LIDOCAINE HCL (PF) 1 % IJ SOLN
INTRAMUSCULAR | Status: DC | PRN
  Administered 2015-09-03: 5 mL via EPIDURAL
  Administered 2015-09-03: 5 mL

## 2015-09-03 MED ORDER — MEPERIDINE HCL 25 MG/ML IJ SOLN: 6.2500 mg | INTRAMUSCULAR | Status: DC | PRN

## 2015-09-03 MED ORDER — KETOROLAC TROMETHAMINE 30 MG/ML IJ SOLN
30.0000 mg | Freq: Four times a day (QID) | INTRAMUSCULAR | Status: DC | PRN
  Administered 2015-09-03: 30 mg via INTRAVENOUS
  Filled 2015-09-03: qty 1

## 2015-09-03 MED ORDER — BUPIVACAINE LIPOSOME 1.3 % IJ SUSP
INTRAMUSCULAR | Status: DC | PRN
  Administered 2015-09-03: 20 mL

## 2015-09-03 MED ORDER — PRENATAL MULTIVITAMIN CH
1.0000 | ORAL_TABLET | Freq: Every day | ORAL | Status: DC
  Administered 2015-09-04 – 2015-09-06 (×3): 1 via ORAL
  Filled 2015-09-03 (×3): qty 1

## 2015-09-03 MED ORDER — NALBUPHINE HCL 10 MG/ML IJ SOLN
5.0000 mg | INTRAMUSCULAR | Status: DC | PRN
  Filled 2015-09-03: qty 0.5

## 2015-09-03 MED ORDER — WITCH HAZEL-GLYCERIN EX PADS: 1.0000 "application " | MEDICATED_PAD | CUTANEOUS | Status: DC | PRN

## 2015-09-03 MED ORDER — CEFAZOLIN SODIUM-DEXTROSE 2-3 GM-% IV SOLR
INTRAVENOUS | Status: DC | PRN
  Administered 2015-09-03: 2 g via INTRAVENOUS

## 2015-09-03 MED ORDER — SODIUM CHLORIDE 0.9 % IJ SOLN: 3.0000 mL | INTRAMUSCULAR | Status: DC | PRN

## 2015-09-03 MED ORDER — DIPHENHYDRAMINE HCL 50 MG/ML IJ SOLN: 12.5000 mg | INTRAMUSCULAR | Status: DC | PRN

## 2015-09-03 MED ORDER — LIDOCAINE-EPINEPHRINE (PF) 2 %-1:200000 IJ SOLN
INTRAMUSCULAR | Status: AC
  Filled 2015-09-03: qty 20

## 2015-09-03 MED ORDER — SODIUM CHLORIDE 0.9 % IV SOLN: Freq: Once | INTRAVENOUS | Status: DC

## 2015-09-03 MED ORDER — SCOPOLAMINE 1 MG/3DAYS TD PT72
1.0000 | MEDICATED_PATCH | Freq: Once | TRANSDERMAL | Status: DC
  Filled 2015-09-03: qty 1

## 2015-09-03 MED ORDER — PHENYLEPHRINE HCL 10 MG/ML IJ SOLN
INTRAMUSCULAR | Status: DC | PRN
  Administered 2015-09-03 (×2): 40 ug via INTRAVENOUS
  Administered 2015-09-03 (×4): 80 ug via INTRAVENOUS

## 2015-09-03 MED ORDER — SODIUM BICARBONATE 8.4 % IV SOLN
INTRAVENOUS | Status: AC
  Filled 2015-09-03: qty 50

## 2015-09-03 MED ORDER — TETANUS-DIPHTH-ACELL PERTUSSIS 5-2.5-18.5 LF-MCG/0.5 IM SUSP
0.5000 mL | Freq: Once | INTRAMUSCULAR | Status: DC
  Filled 2015-09-03: qty 0.5

## 2015-09-03 MED ORDER — SENNOSIDES-DOCUSATE SODIUM 8.6-50 MG PO TABS
2.0000 | ORAL_TABLET | ORAL | Status: DC
  Administered 2015-09-04 – 2015-09-06 (×3): 2 via ORAL
  Filled 2015-09-03 (×3): qty 2

## 2015-09-03 MED ORDER — SIMETHICONE 80 MG PO CHEW
80.0000 mg | CHEWABLE_TABLET | ORAL | Status: DC
  Administered 2015-09-04 – 2015-09-06 (×3): 80 mg via ORAL
  Filled 2015-09-03 (×3): qty 1

## 2015-09-03 MED ORDER — SODIUM CHLORIDE 0.9 % IJ SOLN
INTRAMUSCULAR | Status: AC
  Filled 2015-09-03: qty 20

## 2015-09-03 MED ORDER — OXYCODONE-ACETAMINOPHEN 5-325 MG PO TABS: 2.0000 | ORAL_TABLET | ORAL | Status: DC | PRN

## 2015-09-03 MED ORDER — EPHEDRINE 5 MG/ML INJ: 10.0000 mg | INTRAVENOUS | Status: DC | PRN

## 2015-09-03 MED ORDER — LACTATED RINGERS IV SOLN
INTRAVENOUS | Status: DC | PRN
  Administered 2015-09-03: 20:00:00 via INTRAVENOUS

## 2015-09-03 MED ORDER — LACTATED RINGERS IV SOLN: INTRAVENOUS | Status: DC

## 2015-09-03 MED ORDER — IBUPROFEN 600 MG PO TABS
600.0000 mg | ORAL_TABLET | Freq: Four times a day (QID) | ORAL | Status: DC
  Administered 2015-09-04 – 2015-09-06 (×10): 600 mg via ORAL
  Filled 2015-09-03 (×13): qty 1

## 2015-09-03 MED ORDER — NALOXONE HCL 1 MG/ML IJ SOLN
1.0000 ug/kg/h | INTRAVENOUS | Status: DC | PRN
  Filled 2015-09-03: qty 2

## 2015-09-03 MED ORDER — PHENYLEPHRINE 40 MCG/ML (10ML) SYRINGE FOR IV PUSH (FOR BLOOD PRESSURE SUPPORT)
80.0000 ug | PREFILLED_SYRINGE | INTRAVENOUS | Status: DC | PRN
  Administered 2015-09-03 (×2): 80 ug via INTRAVENOUS
  Filled 2015-09-03 (×2): qty 20

## 2015-09-03 MED ORDER — MORPHINE SULFATE (PF) 0.5 MG/ML IJ SOLN
INTRAMUSCULAR | Status: DC | PRN
  Administered 2015-09-03: 3000 ug via EPIDURAL

## 2015-09-03 MED ORDER — SIMETHICONE 80 MG PO CHEW: 80.0000 mg | CHEWABLE_TABLET | ORAL | Status: DC | PRN

## 2015-09-03 MED ORDER — MORPHINE SULFATE 0.5 MG/ML IJ SOLN
INTRAMUSCULAR | Status: AC
  Filled 2015-09-03: qty 100

## 2015-09-03 MED ORDER — PROMETHAZINE HCL 25 MG/ML IJ SOLN: 6.2500 mg | INTRAMUSCULAR | Status: DC | PRN

## 2015-09-03 MED ORDER — FENTANYL CITRATE (PF) 100 MCG/2ML IJ SOLN
INTRAMUSCULAR | Status: AC
  Filled 2015-09-03: qty 4

## 2015-09-03 MED ORDER — NALBUPHINE HCL 10 MG/ML IJ SOLN
10.0000 mg | INTRAMUSCULAR | Status: AC
Start: 2015-09-03 — End: 2015-09-03
  Administered 2015-09-03: 10 mg via INTRAVENOUS
  Filled 2015-09-03: qty 1

## 2015-09-03 MED ORDER — LACTATED RINGERS IV SOLN
INTRAVENOUS | Status: DC
  Administered 2015-09-04 (×2): via INTRAVENOUS

## 2015-09-03 MED ORDER — SIMETHICONE 80 MG PO CHEW
80.0000 mg | CHEWABLE_TABLET | Freq: Three times a day (TID) | ORAL | Status: DC
  Administered 2015-09-04 – 2015-09-06 (×8): 80 mg via ORAL
  Filled 2015-09-03 (×8): qty 1

## 2015-09-03 MED ORDER — DIPHENHYDRAMINE HCL 25 MG PO CAPS: 25.0000 mg | ORAL_CAPSULE | ORAL | Status: DC | PRN

## 2015-09-03 MED ORDER — OXYCODONE-ACETAMINOPHEN 5-325 MG PO TABS
1.0000 | ORAL_TABLET | ORAL | Status: DC | PRN
  Administered 2015-09-04 – 2015-09-06 (×6): 1 via ORAL
  Filled 2015-09-03 (×6): qty 1

## 2015-09-03 MED ORDER — ZOLPIDEM TARTRATE 5 MG PO TABS: 5.0000 mg | ORAL_TABLET | Freq: Every evening | ORAL | Status: DC | PRN

## 2015-09-03 MED ORDER — FENTANYL CITRATE (PF) 100 MCG/2ML IJ SOLN
INTRAMUSCULAR | Status: DC | PRN
  Administered 2015-09-03: 100 ug via EPIDURAL

## 2015-09-03 MED ORDER — LANOLIN HYDROUS EX OINT: 1.0000 "application " | TOPICAL_OINTMENT | CUTANEOUS | Status: DC | PRN

## 2015-09-03 MED ORDER — OXYTOCIN 40 UNITS IN LACTATED RINGERS INFUSION - SIMPLE MED: 62.5000 mL/h | INTRAVENOUS | Status: DC

## 2015-09-03 MED ORDER — DIBUCAINE 1 % RE OINT
1.0000 "application " | TOPICAL_OINTMENT | RECTAL | Status: DC | PRN
  Filled 2015-09-03: qty 28

## 2015-09-03 MED ORDER — ONDANSETRON HCL 4 MG/2ML IJ SOLN: 4.0000 mg | Freq: Three times a day (TID) | INTRAMUSCULAR | Status: DC | PRN

## 2015-09-03 MED ORDER — FENTANYL 2.5 MCG/ML BUPIVACAINE 1/10 % EPIDURAL INFUSION (WH - ANES)
14.0000 mL/h | INTRAMUSCULAR | Status: DC | PRN
  Administered 2015-09-03: 14 mL/h via EPIDURAL
  Filled 2015-09-03 (×2): qty 125

## 2015-09-03 SURGICAL SUPPLY — 34 items
CLAMP CORD UMBIL (MISCELLANEOUS) IMPLANT
CLOSURE WOUND 1/2 X4 (GAUZE/BANDAGES/DRESSINGS) ×1
CLOTH BEACON ORANGE TIMEOUT ST (SAFETY) ×3 IMPLANT
CONTAINER PREFILL 10% NBF 15ML (MISCELLANEOUS) IMPLANT
DRAPE SHEET LG 3/4 BI-LAMINATE (DRAPES) IMPLANT
DRSG OPSITE POSTOP 4X10 (GAUZE/BANDAGES/DRESSINGS) ×3 IMPLANT
DURAPREP 26ML APPLICATOR (WOUND CARE) ×3 IMPLANT
ELECT REM PT RETURN 9FT ADLT (ELECTROSURGICAL) ×3
ELECTRODE REM PT RTRN 9FT ADLT (ELECTROSURGICAL) ×1 IMPLANT
EXTRACTOR VACUUM M CUP 4 TUBE (SUCTIONS) IMPLANT
EXTRACTOR VACUUM M CUP 4' TUBE (SUCTIONS)
GLOVE BIO SURGEON STRL SZ7.5 (GLOVE) ×3 IMPLANT
GOWN STRL REUS W/TWL LRG LVL3 (GOWN DISPOSABLE) ×6 IMPLANT
KIT ABG SYR 3ML LUER SLIP (SYRINGE) IMPLANT
NEEDLE HYPO 22GX1.5 SAFETY (NEEDLE) ×3 IMPLANT
NEEDLE HYPO 25X5/8 SAFETYGLIDE (NEEDLE) IMPLANT
NEEDLE SPNL 20GX3.5 QUINCKE YW (NEEDLE) IMPLANT
NS IRRIG 1000ML POUR BTL (IV SOLUTION) ×3 IMPLANT
PACK C SECTION WH (CUSTOM PROCEDURE TRAY) ×3 IMPLANT
PENCIL SMOKE EVAC W/HOLSTER (ELECTROSURGICAL) ×3 IMPLANT
STRIP CLOSURE SKIN 1/2X4 (GAUZE/BANDAGES/DRESSINGS) ×2 IMPLANT
SUT MNCRL 0 VIOLET CTX 36 (SUTURE) ×2 IMPLANT
SUT MNCRL AB 3-0 PS2 27 (SUTURE) ×3 IMPLANT
SUT MON AB 2-0 CT1 27 (SUTURE) ×3 IMPLANT
SUT MON AB-0 CT1 36 (SUTURE) ×6 IMPLANT
SUT MONOCRYL 0 CTX 36 (SUTURE) ×4
SUT PLAIN 0 NONE (SUTURE) IMPLANT
SUT PLAIN 2 0 (SUTURE)
SUT PLAIN 2 0 XLH (SUTURE) ×3 IMPLANT
SUT PLAIN ABS 2-0 CT1 27XMFL (SUTURE) IMPLANT
SYR 20CC LL (SYRINGE) IMPLANT
SYR CONTROL 10ML LL (SYRINGE) ×3 IMPLANT
TOWEL OR 17X24 6PK STRL BLUE (TOWEL DISPOSABLE) ×3 IMPLANT
TRAY FOLEY CATH SILVER 14FR (SET/KITS/TRAYS/PACK) ×3 IMPLANT

## 2015-09-03 NOTE — Anesthesia Postprocedure Evaluation (Signed)
  Anesthesia Post-op Note  Patient: Sydney [name]  Procedure(s) Performed: Procedure(s) (LRB): CESAREAN SECTION (N/A)  Patient Location: PACU  Anesthesia Type: Epidural  Level of Consciousness: awake and alert   Airway and Oxygen Therapy: Patient Spontanous Breathing  Post-op Pain: mild  Post-op Assessment: Post-op Vital signs reviewed, Patient's Cardiovascular Status Stable, Respiratory Function Stable, Patent Airway and No signs of Nausea or vomiting  Last Vitals:  Filed Vitals:   09/03/15 2115  BP: 124/68  Pulse: 76  Temp:   Resp: 16    Post-op Vital Signs: stable   Complications: No apparent anesthesia complications

## 2015-09-03 NOTE — Progress Notes (Signed)
TC from [place], RN reporting patient is "having late decels" with every contraction / requesting order to decrease pitocin to 4 mU/min. V.O. Given to reduce pitocin to 4 mU/min.  Dr. Ronita Hipps present at the time of call - FHR tracing reviewed - agrees with plan / will go see pt to discuss possibility of primary cesarean delivery, if no improvement in FHR tracing  Graceann Congress MSN, CNM 09/03/2015 1:00 PM

## 2015-09-03 NOTE — Op Note (Signed)
Cesarean Section Procedure Note  Indications: failure to progress: arrest of dilation, non-reassuring fetal status and PEC  Pre-operative Diagnosis: 37 week 3 day pregnancy.  Post-operative Diagnosis: same  Surgeon: Lovenia Kim   Assistants: Renato Battles, CNM  Anesthesia: Epidural anesthesia and Local anesthesia 0.25.% bupivacaine  ASA Class: 2  Procedure Details  The patient was seen in the Holding Room. The risks, benefits, complications, treatment options, and expected outcomes were discussed with the patient.  The patient concurred with the proposed plan, giving informed consent. The risks of anesthesia, infection, bleeding and possible injury to other organs discussed. Injury to bowel, bladder, or ureter with possible need for repair discussed. Possible need for transfusion with secondary risks of hepatitis or HIV acquisition discussed. Post operative complications to include but not limited to DVT, PE and Pneumonia noted. The site of surgery properly noted/marked. The patient was taken to Operating Room # 9, identified as Sydney [name] and the procedure verified as C-Section Delivery. A Time Out was held and the above information confirmed.  After induction of anesthesia, the patient was draped and prepped in the usual sterile manner. A Pfannenstiel incision was made and carried down through the subcutaneous tissue to the fascia. Fascial incision was made and extended transversely using Mayo scissors. The fascia was separated from the underlying rectus tissue superiorly and inferiorly. The peritoneum was identified and entered. Peritoneal incision was extended longitudinally. The utero-vesical peritoneal reflection was incised transversely and the bladder flap was bluntly freed from the lower uterine segment. A low transverse uterine incision(Kerr hysterotomy) was made. Delivered from OA presentation with vacuum assistance was a  female with Apgar scores of 8 at one minute and 9 at five  minutes. Bulb suctioning gently performed. Neonatal team in attendance.After the umbilical cord was clamped and cut cord blood was obtained for evaluation. The placenta was removed intact and appeared normal. The uterus was curetted with a dry lap pack. Good hemostasis was noted.The uterine outline, tubes and ovaries appeared normal. The uterine incision was closed with running locked sutures of 0 Monocryl x 2 layers. Intramyometrial hemabate given. Hemostasis was observed. Lavage was carried out until clear.The parietal peritoneum was closed with a running 2-0 Monocryl suture. The fascia was then reapproximated with running sutures of 0 Monocryl. The skin was reapproximated with 3-0 monocryl after Jasper closure with 2-0 plain.  Instrument, sponge, and needle counts were correct prior the abdominal closure and at the conclusion of the case.   Findings: FTLM, OT, anterior placenta  Estimated Blood Loss:  500         Drains: foley                 Specimens: placenta                 Complications:  None; patient tolerated the procedure well.         Disposition: PACU - hemodynamically stable.         Condition: stable  Attending Attestation: I performed the procedure.

## 2015-09-03 NOTE — Progress Notes (Signed)
Sydney [name] is a 24 y.o. G1P0 at [redacted]w[redacted]d by LMP admitted for induction of labor due to Pre-eclamptic toxemia of pregnancy..  Subjective: Comfortable No headache. No SOB or CP  Objective: BP 123/76 mmHg  Pulse 74  Temp(Src) 98 F (36.7 C) (Oral)  Resp 18  Ht 5\' 7"  (1.702 m)  Wt 106.505 kg (234 lb 12.8 oz)  BMI 36.77 kg/m2  SpO2 99%  LMP 12/15/2014 I/O last 3 completed shifts: In: 1685.8 [P.O.:670; I.V.:1015.8] Out: 1700 [Urine:1700] Total I/O In: 1652.5 [P.O.:960; I.V.:692.5] Out: 1245 [Urine:1045; Emesis/NG output:200]  FHT:  FHR: 145 bpm, variability: minimal ,  accelerations:  Abscent,  decelerations:  Present occ variable UC:   regular, every 1-4 minutes SVE:   Dilation: 5.5 Effacement (%): 50 Station: -3 Exam by:: Dr Ronita Hipps  IUPC- 150-200  Labs: Lab Results  Component Value Date   WBC 11.3* 09/03/2015   HGB 8.8* 09/03/2015   HCT 28.0* 09/03/2015   MCV 80.9 09/03/2015   PLT 161 09/03/2015    Assessment / Plan: Active phase arrest  Non reassuring FHR  Labor: no progress Preeclampsia:  on magnesium sulfate, no signs or symptoms of toxicity, intake and ouput balanced and labs stable Fetal Wellbeing:  Category II Pain Control:  Epidural I/D:  n/a Anticipated MOD:  Proceed with csection. Consent done.  Miguel Medal J 09/03/2015, 6:45 PM

## 2015-09-03 NOTE — Transfer of Care (Signed)
Immediate Anesthesia Transfer of Care Note  Patient: Sydney [name]  Procedure(s) Performed: Procedure(s): CESAREAN SECTION (N/A)  Patient Location: PACU  Anesthesia Type:Epidural  Level of Consciousness: awake, alert  and oriented  Airway & Oxygen Therapy: Patient Spontanous Breathing  Post-op Assessment: Report given to RN and Post -op Vital signs reviewed and stable  Post vital signs: Reviewed and stable  Last Vitals:  Filed Vitals:   09/03/15 1830  BP: 123/76  Pulse: 74  Temp:   Resp:     Complications: No apparent anesthesia complications

## 2015-09-03 NOTE — Anesthesia Procedure Notes (Signed)
Epidural Patient location during procedure: OB Start time: 09/03/2015 11:50 AM End time: 09/03/2015 11:55 AM  Staffing Anesthesiologist: Suella Broad D Performed by: anesthesiologist   Preanesthetic Checklist Completed: patient identified, site marked, surgical consent, pre-op evaluation, timeout performed, IV checked, risks and benefits discussed and monitors and equipment checked  Epidural Patient position: sitting Prep: DuraPrep Patient monitoring: heart rate, continuous pulse ox and blood pressure Approach: midline Location: L4-L5 Injection technique: LOR saline  Needle:  Needle type: Tuohy  Needle gauge: 18 G Needle length: 9 cm and 9 Catheter type: closed end flexible Catheter size: 20 Guage Test dose: negative  Assessment Events: blood not aspirated, injection not painful, no injection resistance, negative IV test and no paresthesia  Additional Notes LOR @ 5  Patient tolerated the insertion well without complications.Reason for block:procedure for pain

## 2015-09-03 NOTE — Progress Notes (Addendum)
Patient ID: Sydney [name], female   DOB: 01-07-1991, 24 y.o.   MRN: [id number] S: Very tearful.  Not feeling well, vomiting after epidural insertion, pain well-controlled with an epidural.   O: Filed Vitals:   09/03/15 1220 09/03/15 1225 09/03/15 1230 09/03/15 1300  BP: 129/77 139/90 127/78 127/67  Pulse: 72 109 84 65  Temp:      TempSrc:      Resp:    18  Height:      Weight:      SpO2: 100% 100% 99% 97%     FHT:  FHR: 115-120 bpm, variability: moderate,  accelerations:  Abscent,  decelerations:  Present variables with contractions / FHR down to 80s-90s after epidural insertion - Phenylephrine 160 mcg given by RN for FHR UC:   regular, every 1-2 minutes SVE:   Dilation: 1 Effacement (%): 70 Station: -2 Exam by:: Sydney Hitch RN  Cervical Balloon still in place - traction recently placed by RN Pitocin: 8 mU/min  A / P: Induction of labor due to preeclampsia,  progressing well on pitocin  Fetal Wellbeing:  Category II Pain Control:  Epidural  Continue Magnesium 2g/hr Active management of IOL  AROM when cervical balloon is out Increase pitocin uo to 10 mU/min until AROM  Anticipated MOD:  Very cautious for NSVD   *Dr. Ronita Hipps notified of assessment / will monitor FHR tracing and talk with pt regarding MOD  Sydney [name], Sydney Mages MSN, CNM 09/03/2015, 12:35 PM

## 2015-09-03 NOTE — Progress Notes (Signed)
S:  Bad cramps - terrible belly pain  O:  VS: Blood pressure 130/68, pulse 76, temperature 98.3 F (36.8 C), temperature source Oral, resp. rate 18, height 5\' 7"  (1.702 m), weight 105.371 kg (232 lb 4.8 oz), last menstrual period 12/15/2014.        FHR : baseline 130 / variability moderate / accelerations + / no decelerations        Toco: UI and irregular ctx        Cervix : posterior to maternal left / 1cm / 70% / vtx -2        Membranes: intact with small bloody show        Cervical balloon placed with minimal difficulty - moderate discomfort with exam  A: Induction of labor     FHR category 1     Cervical ripening effective      Pain intolerance   P:       Nubain 10mg  IV now for pain      Early epidural likely      PIH labs 0900 with pitocin start      Traction to balloon Q2 hours            pitocin no higher than 6mu/min until balloon out            then AROM and place IUPC - titrate pitocin per MVUs      Place foley - STRICT I & O - monitor for poor urinary outpt    Artelia Laroche CNM, MSN, FACNM 09/03/2015, 7:27 AM

## 2015-09-03 NOTE — Anesthesia Preprocedure Evaluation (Signed)
Anesthesia Evaluation  Patient identified by MRN, date of birth, ID band Patient awake    Reviewed: Allergy & Precautions, NPO status , Patient's Chart, lab work & pertinent test results  History of Anesthesia Complications (+) PONV  Airway Mallampati: II  TM Distance: >3 FB Neck ROM: Full    Dental  (+) Teeth Intact   Pulmonary asthma ,    breath sounds clear to auscultation       Cardiovascular hypertension,  Rhythm:Regular Rate:Normal     Neuro/Psych  Headaches, negative psych ROS   GI/Hepatic negative GI ROS, Neg liver ROS,   Endo/Other  negative endocrine ROS  Renal/GU negative Renal ROS  negative genitourinary   Musculoskeletal negative musculoskeletal ROS (+)   Abdominal   Peds negative pediatric ROS (+)  Hematology negative hematology ROS (+)   Anesthesia Other Findings   Reproductive/Obstetrics (+) Pregnancy                             Lab Results  Component Value Date   WBC 11.3* 09/03/2015   HGB 8.8* 09/03/2015   HCT 28.0* 09/03/2015   MCV 80.9 09/03/2015   PLT 161 09/03/2015    Anesthesia Physical Anesthesia Plan  ASA: III  Anesthesia Plan: Epidural   Post-op Pain Management:    Induction:   Airway Management Planned:   Additional Equipment:   Intra-op Plan:   Post-operative Plan:   Informed Consent: I have reviewed the patients History and Physical, chart, labs and discussed the procedure including the risks, benefits and alternatives for the proposed anesthesia with the patient or authorized representative who has indicated his/her understanding and acceptance.     Plan Discussed with:   Anesthesia Plan Comments:         Anesthesia Quick Evaluation

## 2015-09-03 NOTE — Progress Notes (Signed)
S:  Comfortable with epidural - pressure sometimes / no pain        Resting at intervals lateral position  O:  VS: Blood pressure 121/73, pulse 68, temperature 97.7 F (36.5 C), temperature source Axillary, resp. rate 18, height 5\' 7"  (1.702 m), weight 106.505 kg (234 lb 12.8 oz), last menstrual period 12/15/2014, SpO2 99 %.          [place] labs from 0900 reviewed stable with mag level at 5.0         FHR : baseline 120 / variability decreased / accelerations 10x10 / occasional variable decelerations        Toco: contractions every 2-4 minutes / moderate         Cervix : 6cm / posterior to maternal left / 90% / vtx -3        Membranes: BBOW - AROM - clear / IUPC placed without difficulty        I&O - net + 550 ml       Foley draining dark yellow urine  A: active labor     FHR category 2  P: titrate pitocin to achieve adequate MVU      monitor closely for fetal descent - recheck 2 hours      Dr Ronita Hipps updated    Artelia Laroche CNM, MSN, [facility] 09/03/2015, 4:34 PM

## 2015-09-04 LAB — CBC
HCT: 24.4 % — ABNORMAL LOW (ref 36.0–46.0)
HEMOGLOBIN: 7.8 g/dL — AB (ref 12.0–15.0)
MCH: 26 pg (ref 26.0–34.0)
MCHC: 32 g/dL (ref 30.0–36.0)
MCV: 81.3 fL (ref 78.0–100.0)
Platelets: 142 10*3/uL — ABNORMAL LOW (ref 150–400)
RBC: 3 MIL/uL — AB (ref 3.87–5.11)
RDW: 16.2 % — ABNORMAL HIGH (ref 11.5–15.5)
WBC: 10.7 10*3/uL — AB (ref 4.0–10.5)

## 2015-09-04 LAB — COMPREHENSIVE METABOLIC PANEL
ALK PHOS: 150 U/L — AB (ref 38–126)
ALT: 13 U/L — AB (ref 14–54)
AST: 28 U/L (ref 15–41)
Albumin: 2.3 g/dL — ABNORMAL LOW (ref 3.5–5.0)
Anion gap: 10 (ref 5–15)
BUN: 11 mg/dL (ref 6–20)
CALCIUM: 7.1 mg/dL — AB (ref 8.9–10.3)
CO2: 23 mmol/L (ref 22–32)
CREATININE: 0.81 mg/dL (ref 0.44–1.00)
Chloride: 102 mmol/L (ref 101–111)
Glucose, Bld: 97 mg/dL (ref 65–99)
Potassium: 4.3 mmol/L (ref 3.5–5.1)
SODIUM: 135 mmol/L (ref 135–145)
Total Bilirubin: 0.6 mg/dL (ref 0.3–1.2)
Total Protein: 5.1 g/dL — ABNORMAL LOW (ref 6.5–8.1)

## 2015-09-04 NOTE — Anesthesia Postprocedure Evaluation (Signed)
  Anesthesia Post-op Note  Patient: Sydney [name]  Procedure(s) Performed: Procedure(s): CESAREAN SECTION (N/A)  Patient Location: PACU and Nursing Unit  Anesthesia Type:Epidural  Level of Consciousness: awake, alert  and oriented  Airway and Oxygen Therapy: Patient Spontanous Breathing  Post-op Pain: mild  Post-op Assessment: Patient's Cardiovascular Status Stable, Respiratory Function Stable, No signs of Nausea or vomiting, Adequate PO intake, Pain level controlled, No headache, No backache and Patient able to bend at knees LLE Motor Response: Purposeful movement LLE Sensation: Full sensation RLE Motor Response: Purposeful movement RLE Sensation: Full sensation      Post-op Vital Signs: Reviewed and stable  Last Vitals:  Filed Vitals:   09/04/15 0814  BP:   Pulse:   Temp: 36.6 C  Resp:     Complications: No apparent anesthesia complications

## 2015-09-04 NOTE — Addendum Note (Signed)
Addendum  created 09/04/15 [id number] by Elenore Paddy, CRNA   Modules edited: Notes Section   Notes Section:  File: [id number]

## 2015-09-04 NOTE — Progress Notes (Signed)
VS reviewed, BP stable Excellent diuresis Mild HA with some response from Motrin  A/P: POD #1 PEC Discontinue MgSO4, observe x4 hrs then transfer to MB unit, continue strict I/O, routine post op orders

## 2015-09-04 NOTE — Progress Notes (Signed)
POD # 1/AICU Day #1  Subjective: Pt reports feeling ok/ Pain controlled with Motrin No HA, visual disturbance, or epigastric pain Tolerating po/ Foley in place/ No n/v/ Flatus absent Activity: up with assistance Bleeding is light Newborn info:  Information for the patient's newborn:  Xolani, Degracia [[id number]]  female   Circumcision: planning outpt/ Feeding: breast   Objective:  VS:  Filed Vitals:   09/04/15 0814 09/04/15 0905 09/04/15 1000 09/04/15 1005  BP:  116/64  134/75  Pulse:  75 82 83  Temp: 97.9 F (36.6 C)     TempSrc: Oral     Resp:      Height:      Weight:      SpO2:   96%      I&O: Intake/Output      09/07 0701 - 09/08 0700 09/08 0701 - 09/09 0700   P.O. 2210 360   I.V. (mL/kg) 2518.5 (23.6) 376.3 (3.5)   Total Intake(mL/kg) 4728.5 (44.4) 736.3 (6.9)   Urine (mL/kg/hr) 2500 (1) 375 (0.8)   Emesis/NG output 200 (0.1)    Blood 700 (0.3)    Total Output 3400 375   Net +1328.5 +361.3           Recent Labs  09/03/15 2137 09/04/15 0615  WBC 10.9* 10.7*  HGB 8.6* 7.8*  HCT 27.4* 24.4*  PLT 150 142*    Blood type: --/--/B POS, B POS (09/06 1345) Rubella: Immune (02/16 0000)    Physical Exam:  General: alert, cooperative and no distress CV: Regular rate and rhythm Resp: CTA bilaterally Abdomen: soft, nontender, normal bowel sounds Incision: Covered with Tegaderm and honeycomb dressing; no significant drainage, edema, bruising, or erythema; well approximated with suture. Uterine Fundus: firm, below umbilicus, nontender Lochia: minimal GU: Foley to SD, clear amber Ext: edema 2+ BLE and Homans sign is negative, no sign of DVT   Assessment: POD # 1/ G1P1001/ S/P C/Section d/t arrest of labor, non-reasurring FHT  PEC, delivered Dependent edema Doing well  Plan: Good diuresis BPs stable Labs stable Continue MgSO4 x24 hrs post delivery-consider d/c at that time Continue routine post op orders  Discussed A/P with Dr.  Ronita Hipps.  Signed: Julianne Handler, Delane Ginger, MSN, CNM 09/04/2015, 11:10 AM

## 2015-09-05 ENCOUNTER — Encounter (HOSPITAL_COMMUNITY): Payer: Self-pay | Admitting: Obstetrics and Gynecology

## 2015-09-05 MED ORDER — MAGNESIUM OXIDE 400 (241.3 MG) MG PO TABS
400.0000 mg | ORAL_TABLET | Freq: Every day | ORAL | Status: DC
  Administered 2015-09-05 – 2015-09-06 (×2): 400 mg via ORAL
  Filled 2015-09-05 (×3): qty 1

## 2015-09-05 MED ORDER — HYDROCHLOROTHIAZIDE 12.5 MG PO CAPS: 12.5000 mg | ORAL_CAPSULE | Freq: Every day | ORAL | Status: DC

## 2015-09-05 MED ORDER — HYDROCHLOROTHIAZIDE 12.5 MG PO CAPS
12.5000 mg | ORAL_CAPSULE | Freq: Every day | ORAL | Status: DC
  Administered 2015-09-05 – 2015-09-06 (×2): 12.5 mg via ORAL
  Filled 2015-09-05 (×3): qty 1

## 2015-09-05 MED ORDER — POLYSACCHARIDE IRON COMPLEX 150 MG PO CAPS
150.0000 mg | ORAL_CAPSULE | Freq: Every day | ORAL | Status: DC
  Administered 2015-09-05 – 2015-09-06 (×2): 150 mg via ORAL
  Filled 2015-09-05 (×2): qty 1

## 2015-09-05 MED ORDER — FUROSEMIDE 10 MG/ML IJ SOLN
20.0000 mg | Freq: Once | INTRAMUSCULAR | Status: AC
  Administered 2015-09-05: 20 mg via INTRAVENOUS
  Filled 2015-09-05: qty 2

## 2015-09-05 NOTE — Progress Notes (Signed)
POSTOPERATIVE DAY # 2 S/P CS - PEC / arrest of active labor   S:         Reports feeling tired and sore              No PIH symptoms - no headache today             Tolerating po intake / no nausea / no vomiting / + flatus / no BM             Bleeding is light             Pain controlled with motrin and percocet             Up ad lib / ambulatory/ voiding QS  Newborn breast feeding  / Circumcision planned   O:  VS: BP 136/74 mmHg  Pulse 89  Temp(Src) 98.2 F (36.8 C) (Oral)  Resp 18  Ht 5\' 7"  (1.702 m)  Wt 106.505 kg (234 lb 12.8 oz)  BMI 36.77 kg/m2  SpO2 97%  LMP 10/27/2014  Breastfeeding? Unknown               BP : 136/74 - 129/75 - 132/75 - 111/54 - 137/75   LABS:               Recent Labs  09/03/15 2137 09/04/15 0615  WBC 10.9* 10.7*  HGB 8.6* 7.8*  PLT 150 142*               Bloodtype: --/--/B POS, B POS (09/06 1345)  Rubella: Immune (02/16 0000)               tdap 2016                                           I&O:  Net negative 4101ml             Physical Exam:             Alert and Oriented X3  Lungs: Clear and unlabored  Heart: regular rate and rhythm / no mumurs  Abdomen: soft, non-tender, non-distended, active BS             Fundus: firm, non-tender, Ueven             Dressing intact honeycomb              Incision:   no erythema / no ecchymosis / no drainage  Perineum: intact  Lochia: light  Extremities: 3+ pitting edema, no calf pain or tenderness, negative Homans  A:        POD # 2 S/P CS            Pre-eclampsia            Dependent edema            IDA of pregnancy compounded by ABl anemia  P:        Routine postoperative care              Lasix 20mg  IV prior to IV DC today             Start HCTZ 12.5 daily x 10 days             Polysacaride iron and magnesium 400mg  daily x 6 weeks  Anticipate DC home tomorrow with recheck at [facility] Tuesday         Artelia Laroche CNM, MSN, [facility] 09/05/2015, 11:24 AM

## 2015-09-05 NOTE — Progress Notes (Signed)
UR chart review completed.  

## 2015-09-05 NOTE — Lactation Note (Signed)
This note was copied from the chart of Sydney [name]. Lactation Consultation Note  Patient Name: Sydney [name] [id number] Date: 09/05/2015 Reason for consult: Follow-up assessment Called to observe BF due to baby having low CBG 29 at 2008. RN assisted Mom to give 7 ml of EBM to baby. Mom had baby STS when [place] arrived. Assisted Mom with positioning and obtaining good depth with latch. Nipples are flat but with pre-pumping the nipples will become erect. Demonstrated how to bring bottom lip down for more depth. After BF Mom pre-pumped other breast and received 5 ml of colostrum. Parents giving this back via spoon to baby, then baby latched to breast. Advised Mom to BF with feeding ques but at least every 3 hours, keep baby nursing 15-30 minutes, both breasts when possible. If baby has another low CBG then initiate post pumping and give baby back any amount of EBM she receives. Parents agreeable to plan. Call for assist as needed.   Maternal Data    Feeding Feeding Type: Breast Fed Length of feed: 10 min  LATCH Score/Interventions Latch: Grasps breast easily, tongue down, lips flanged, rhythmical sucking. Intervention(s): Adjust position;Breast massage;Breast compression  Audible Swallowing: A few with stimulation  Type of Nipple: Flat Intervention(s): Hand pump  Comfort (Breast/Nipple): Filling, red/small blisters or bruises, mild/mod discomfort  Problem noted: Mild/Moderate discomfort Interventions (Mild/moderate discomfort): Hand massage;Hand expression;Pre-pump if needed  Hold (Positioning): No assistance needed to correctly position infant at breast. Intervention(s): Support Pillows;Skin to skin  LATCH Score: 7  Lactation Tools Discussed/Used Breast pump type: Manual   Consult Status Consult Status: Follow-up Date: 09/06/15 Follow-up type: In-patient    Katrine Coho 09/05/2015, 10:25 PM

## 2015-09-06 DIAGNOSIS — D62 Acute posthemorrhagic anemia: Secondary | ICD-10-CM | POA: Diagnosis not present

## 2015-09-06 LAB — COMPREHENSIVE METABOLIC PANEL
ALT: 15 U/L (ref 14–54)
AST: 25 U/L (ref 15–41)
Albumin: 2.2 g/dL — ABNORMAL LOW (ref 3.5–5.0)
Alkaline Phosphatase: 115 U/L (ref 38–126)
Anion gap: 9 (ref 5–15)
BUN: 10 mg/dL (ref 6–20)
CO2: 26 mmol/L (ref 22–32)
Calcium: 8 mg/dL — ABNORMAL LOW (ref 8.9–10.3)
Chloride: 103 mmol/L (ref 101–111)
Creatinine, Ser: 0.74 mg/dL (ref 0.44–1.00)
GFR calc Af Amer: >60 mL/min (ref >60)
GFR calc non Af Amer: >60 mL/min (ref >60)
Glucose, Bld: 70 mg/dL (ref 65–99)
Potassium: 3.9 mmol/L (ref 3.5–5.1)
Sodium: 138 mmol/L (ref 135–145)
Total Bilirubin: 0.5 mg/dL (ref 0.3–1.2)
Total Protein: 5.4 g/dL — ABNORMAL LOW (ref 6.5–8.1)

## 2015-09-06 LAB — TYPE AND SCREEN
ABO/RH(D): B POS
Antibody Screen: NEGATIVE
Unit division: 0
Unit division: 0

## 2015-09-06 LAB — CBC
HCT: 24.1 % — ABNORMAL LOW (ref 36.0–46.0)
Hemoglobin: 7.4 g/dL — ABNORMAL LOW (ref 12.0–15.0)
MCH: 25.5 pg — ABNORMAL LOW (ref 26.0–34.0)
MCHC: 30.7 g/dL (ref 30.0–36.0)
MCV: 83.1 fL (ref 78.0–100.0)
Platelets: 177 10*3/uL (ref 150–400)
RBC: 2.9 MIL/uL — ABNORMAL LOW (ref 3.87–5.11)
RDW: 16.4 % — ABNORMAL HIGH (ref 11.5–15.5)
WBC: 6.1 10*3/uL (ref 4.0–10.5)

## 2015-09-06 MED ORDER — OXYCODONE-ACETAMINOPHEN 5-325 MG PO TABS: 1.0000 | ORAL_TABLET | ORAL | Status: DC | PRN

## 2015-09-06 MED ORDER — MAGNESIUM OXIDE 400 (241.3 MG) MG PO TABS: 400.0000 mg | ORAL_TABLET | Freq: Every day | ORAL | Status: DC

## 2015-09-06 MED ORDER — HYDROCHLOROTHIAZIDE 12.5 MG PO CAPS: 12.5000 mg | ORAL_CAPSULE | Freq: Every day | ORAL | Status: DC

## 2015-09-06 MED ORDER — POLYSACCHARIDE IRON COMPLEX 150 MG PO CAPS: 150.0000 mg | ORAL_CAPSULE | Freq: Two times a day (BID) | ORAL | Status: DC

## 2015-09-06 MED ORDER — NALBUPHINE HCL 10 MG/ML IJ SOLN
5.0000 mg | INTRAMUSCULAR | Status: DC
  Filled 2015-09-06: qty 0.5

## 2015-09-06 MED ORDER — IBUPROFEN 600 MG PO TABS: 600.0000 mg | ORAL_TABLET | Freq: Four times a day (QID) | ORAL | Status: DC

## 2015-09-06 NOTE — Progress Notes (Signed)
Upon assessment Pt. Experiencing headache, blurred vision, and reflexes 3 with no clonus. Per pt. This is her baseline, she has been having these symptoms since delivery. BP 128/93at 0050  and 136/80 at 0125. Will continue to monitor pt.

## 2015-09-06 NOTE — Progress Notes (Signed)
Recheck  S:         Reports feeling not feeling any better -did not take medication for sleep or try to sleep             Tolerating po intake / no nausea / no vomiting / + flatus / no BM             Bleeding is light  O:  VS: BP 140/86 mmHg  Pulse 86  Temp(Src) 97.8 F (36.6 C) (Oral)  Resp 18  Ht 5\' 7"  (1.702 m)  Wt 100.154 kg (220 lb 12.8 oz)  BMI 34.57 kg/m2  SpO2 98%  LMP 10/27/2014  Breastfeeding? Unknown                          I&O:  negative 1920  A:        POD # 3 S/P CS            PEC with dependent edema            IDA with ABL anemia - fatigue  P:        Routine postoperative care              DC home - REST at home / sleep 6 hours between pumping episodes             WATER to drink - limit salt - elevate feet at rest / avoid prolonged sitting in NICU              Reviewed importance of rest and recovery for her while baby in NICU  - crucial to her recovery - need to follow diet and medication orders at home - close follow-up in office                OV Monday or Tuesday at [facility] - office will call her with apt details    Sydney [name] CNM, MSN, [id number] [facility] 09/06/2015, 5:04 PM

## 2015-09-06 NOTE — Progress Notes (Signed)
POSTOPERATIVE DAY # 3 S/P CS - arrest of labor / Mild PEC  S:         Reports feeling exhausted - no sleep since admission per patient             Tolerating po intake / no nausea / no vomiting / + flatus / no BM             Bleeding is light             Pain controlled with motrin and percocet - still having a lot of soreness and pain             Up ad lib / ambulatory/ voiding QS  Newborn breast-feeding  / transferred to NICU due to hypoglycemia yesterday   O:  VS: BP 131/77 mmHg  Pulse 67  Temp(Src) 97.8 F (36.6 C) (Oral)  Resp 18  Ht 5\' 7"  (1.702 m)  Wt 100.154 kg (220 lb 12.8 oz)  BMI 34.57 kg/m2  SpO2 98%  LMP 10/27/2014  Breastfeeding? Unknown              BP: 131/77 - 128/93 - 136/82 - 141/88    LABS:   Creat: 0.7  / SGOT: 25 / SGPT: 15              Recent Labs  09/04/15 0615 09/06/15 0700  WBC 10.7* 6.1  HGB 7.8* 7.4*  PLT 142* 177               Bloodtype: --/--/B POS, B POS (09/06 1345)  Rubella: Immune (02/16 0000)               Needs Flu and Tdap boosters                                          I&O: Net negative 1252 ml              Weight:  Admit on 9/6 - 231.8                            PP on 9/7 -     234.3                            Today 9/10 -   221.1   (down 13 pounds)             Physical Exam:             Alert and Oriented X3  Lungs: Clear and unlabored  Heart: regular rate and rhythm / no mumurs  Abdomen: soft, non-tender, non-distended, hypoactive BS             Fundus: firm, non-tender, Ueven             Dressing intact              Incision:  no erythema / no ecchymosis / no drainage  Perineum: intact  Lochia: light  Extremities: 3+ edema, no calf pain or tenderness, negative Homans  A:        POD # 3 S/P CS - arrest of active labor            IDA of pregnancy with compounded ABL anemia - significant fatigue  Mild Pre-eclampsia            Dependent edema  P:        Routine postoperative care              Nubain for  sleep now - reassess later PM for possible DC home as newborn in [place], [place], MSN, [facility] 09/06/2015, 8:54 AM

## 2015-09-06 NOTE — Anesthesia Postprocedure Evaluation (Signed)
  Anesthesia Post-op Note  Patient: Sydney [name]  Procedure(s) Performed: Procedure(s): CESAREAN SECTION (N/A)  Patient Location: Mother/Baby  Anesthesia Type:Epidural  Level of Consciousness: awake and alert   Airway and Oxygen Therapy: Patient Spontanous Breathing  Post-op Pain: mild  Post-op Assessment: Post-op Vital signs reviewed, Patient's Cardiovascular Status Stable, Respiratory Function Stable, No signs of Nausea or vomiting, Pain level controlled, Spinal receding and Patient able to bend at knees      Post-op Vital Signs: Reviewed  Last Vitals:  Filed Vitals:   09/06/15 0640  BP: 131/77  Pulse: 67  Temp: 36.6 C  Resp: 18    Complications: No apparent anesthesia complications
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
## 2015-09-06 NOTE — Addendum Note (Signed)
Addendum  created 09/06/15 4356 by Garner Nash, CRNA   Modules edited: Charges VN, Notes Section   Notes Section:  File: [id number]

## 2016-09-29 ENCOUNTER — Encounter: Payer: Self-pay | Admitting: *Deleted

## 2016-09-29 ENCOUNTER — Other Ambulatory Visit (INDEPENDENT_AMBULATORY_CARE_PROVIDER_SITE_OTHER): Payer: BLUE CROSS/BLUE SHIELD | Admitting: *Deleted

## 2016-09-29 DIAGNOSIS — Z3201 Encounter for pregnancy test, result positive: Secondary | ICD-10-CM | POA: Diagnosis not present

## 2016-09-29 LAB — POCT URINE PREGNANCY: PREG TEST UR: POSITIVE — AB

## 2016-09-29 NOTE — Progress Notes (Signed)
Pt here for UPT and pregnancy verification letter. LMP 08/27/16. She denies any problems/concerns/bleeding since having a positive home UPT. Office UPT is positive as well. Verification letter to be generated and given to patient with instructions to schedule New OB visit at the end of October.

## 2016-10-06 ENCOUNTER — Encounter: Payer: Self-pay | Admitting: *Deleted

## 2016-11-02 ENCOUNTER — Encounter: Payer: BLUE CROSS/BLUE SHIELD | Admitting: Obstetrics & Gynecology

## 2016-11-02 ENCOUNTER — Encounter: Payer: Self-pay | Admitting: Obstetrics & Gynecology

## 2016-11-02 DIAGNOSIS — Z3401 Encounter for supervision of normal first pregnancy, first trimester: Secondary | ICD-10-CM

## 2016-11-02 DIAGNOSIS — O09299 Supervision of pregnancy with other poor reproductive or obstetric history, unspecified trimester: Secondary | ICD-10-CM | POA: Insufficient documentation

## 2016-11-02 DIAGNOSIS — Z3490 Encounter for supervision of normal pregnancy, unspecified, unspecified trimester: Secondary | ICD-10-CM | POA: Insufficient documentation

## 2016-11-02 DIAGNOSIS — Z98891 History of uterine scar from previous surgery: Secondary | ICD-10-CM | POA: Insufficient documentation

## 2017-09-07 ENCOUNTER — Encounter (HOSPITAL_COMMUNITY): Payer: Self-pay

## 2018-12-01 ENCOUNTER — Ambulatory Visit: Payer: Self-pay | Admitting: Obstetrics and Gynecology

## 2018-12-01 VITALS — BP 126/80 | HR 83 | Temp 98.3°F | Wt 194.6 lb

## 2018-12-01 DIAGNOSIS — N390 Urinary tract infection, site not specified: Secondary | ICD-10-CM | POA: Insufficient documentation

## 2018-12-01 DIAGNOSIS — R350 Frequency of micturition: Secondary | ICD-10-CM

## 2018-12-01 LAB — POC URINALSYSI DIPSTICK (AUTOMATED)
BILIRUBIN UA: NEGATIVE
GLUCOSE UA: NEGATIVE
KETONES UA: NEGATIVE
Leukocytes, UA: NEGATIVE
Nitrite, UA: NEGATIVE
Protein, UA: POSITIVE — AB
RBC UA: POSITIVE
SPEC GRAV UA: 1.015 (ref 1.010–1.025)
Urobilinogen, UA: 0.2 E.U./dL
pH, UA: 6 (ref 5.0–8.0)

## 2018-12-01 LAB — POCT URINE PREGNANCY: Preg Test, Ur: NEGATIVE

## 2018-12-01 MED ORDER — CIPROFLOXACIN HCL 500 MG PO TABS: 500.0000 mg | ORAL_TABLET | Freq: Two times a day (BID) | ORAL | 0 refills | Status: AC

## 2018-12-01 MED ORDER — PHENAZOPYRIDINE HCL 200 MG PO TABS: 200.0000 mg | ORAL_TABLET | Freq: Three times a day (TID) | ORAL | 0 refills | Status: DC | PRN

## 2018-12-01 NOTE — Progress Notes (Signed)
  Subjective:     Patient ID: Sydney [name], female   DOB: 01-10-1991, 27 y.o.   MRN: [id number]  HPI  Ms.Sydney [name] is a 27 y.o. female here with dysuria, urgency and frequency X 2 days. She has a history of frequent UTI's; 4 in the last year. Says she has never seen urology. In the last 24 hour hours she has developed "kidney pain". She feels the pain when she sits back in a chair or at night when she is trying to sleep. No fever. Has been taking OTC azo which is not helping her dysuria. Some lower abdominal cramping. No vaginal discharge. Sexually active with 1 partner.   Review of Systems  Constitutional: Negative for fever.  Gastrointestinal: Positive for abdominal pain. Negative for nausea and vomiting.  Genitourinary: Positive for dysuria, flank pain, frequency and urgency.   Objective:   Physical Exam  Constitutional: She appears well-developed and well-nourished.  Non-toxic appearance. She does not have a sickly appearance. She does not appear ill. No distress.  Abdominal: Soft. She exhibits no distension. There is tenderness (Over superpubic area. No rebound) in the suprapubic area. There is CVA tenderness (Bilateral ). There is no rigidity and no guarding.  Skin: She is not diaphoretic.  Psychiatric: She has a normal mood and affect.   Results for orders placed or performed in visit on 12/01/18 (from the past 48 hour(s))  POCT Urinalysis Dipstick (Automated)     Status: Abnormal   Collection Time: 12/01/18  8:47 AM  Result Value Ref Range   Color, UA ORANGE    Clarity, UA clear    Glucose, UA Negative Negative   Bilirubin, UA neg    Ketones, UA neg    Spec Grav, UA 1.015 1.010 - 1.025   Blood, UA positive    pH, UA 6.0 5.0 - 8.0   Protein, UA Positive (A) Negative   Urobilinogen, UA 0.2 0.2 or 1.0 E.U./dL   Nitrite, UA neg    Leukocytes, UA Negative Negative  POCT urine pregnancy     Status: None   Collection Time: 12/01/18  9:10 AM  Result Value Ref Range    Preg Test, Ur Negative Negative   Blood pressure 126/80, pulse 83, temperature 98.3 F (36.8 C), weight 194 lb 9.6 oz (88.3 kg), SpO2 98 %, unknown if currently breastfeeding.   Assessment:   1. Frequent urination   2. Complicated UTI (urinary tract infection)    Plan:   - Rx: Cipro, pregnancy test negative today. Patient on birth control bills - Rx: Pyridium  - recommend patient see [facility] for frequent UTI's - discussed alternative diagnoses if symptoms do not improve: STI's, kidney stone - Return for f/u if symptoms do not improve - increase oral hydration, urinate before and after intercourse. - Recommend annual GYN care  Rasch, Artist Pais, NP 12/01/2018 9:41 AM

## 2019-03-06 ENCOUNTER — Ambulatory Visit (HOSPITAL_COMMUNITY)
Admission: RE | Admit: 2019-03-06 | Discharge: 2019-03-06 | Disposition: A | Discharge status: Home / Self Care | Payer: Medicaid Other | Source: Ambulatory Visit | Attending: Family Medicine | Admitting: Family Medicine

## 2019-03-06 ENCOUNTER — Encounter: Payer: Self-pay | Admitting: Family Medicine

## 2019-03-06 ENCOUNTER — Ambulatory Visit (INDEPENDENT_AMBULATORY_CARE_PROVIDER_SITE_OTHER): Payer: Medicaid Other | Admitting: Family Medicine

## 2019-03-06 VITALS — BP 124/84 | HR 102 | Resp 15 | Ht 67.0 in | Wt 195.0 lb

## 2019-03-06 DIAGNOSIS — F419 Anxiety disorder, unspecified: Secondary | ICD-10-CM

## 2019-03-06 DIAGNOSIS — Z3041 Encounter for surveillance of contraceptive pills: Secondary | ICD-10-CM

## 2019-03-06 DIAGNOSIS — F339 Major depressive disorder, recurrent, unspecified: Secondary | ICD-10-CM | POA: Diagnosis not present

## 2019-03-06 DIAGNOSIS — R109 Unspecified abdominal pain: Secondary | ICD-10-CM | POA: Diagnosis not present

## 2019-03-06 DIAGNOSIS — E669 Obesity, unspecified: Secondary | ICD-10-CM

## 2019-03-06 MED ORDER — KETOROLAC TROMETHAMINE 10 MG PO TABS: 10.0000 mg | ORAL_TABLET | Freq: Four times a day (QID) | ORAL | 0 refills | Status: DC | PRN

## 2019-03-06 MED ORDER — FLUOXETINE HCL 20 MG PO TABS: 20.0000 mg | ORAL_TABLET | Freq: Every day | ORAL | 1 refills | Status: DC

## 2019-03-06 MED ORDER — HYDROXYZINE HCL 25 MG PO TABS: 25.0000 mg | ORAL_TABLET | Freq: Two times a day (BID) | ORAL | 0 refills | Status: DC | PRN

## 2019-03-06 NOTE — Patient Instructions (Addendum)
    Thank you for coming into the office today. I appreciate the opportunity to provide you with the care for your health and wellness.  Youtube-Yoga with Adriene  Yoga, meditation, and alternate nostril breathing.  Diet: Drink a lot of water: 2L daily. Drink one cup of water before each meal.  Seasonal shopping: buy the produce that is in season, this can help make it more affordable.  Implement children on yourself.  Exercise: at least- 30-60 minutes 3-5 days a week. Walking   Please pick up your medications and take as directed.  Taper your zoloft: every other day for a week, then every 3 days for a week, then start the Prozac.  Take pain medication as you need.  It was a pleasure to see you and I look forward to continuing to work together on your health and well-being. Please do not hesitate to call the office if you need care or have questions about your care.  Have a wonderful day and week.  With Gratitude,  Cherly Beach, DNP, AGNP-BC
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
## 2019-03-07 ENCOUNTER — Emergency Department (HOSPITAL_COMMUNITY): Payer: Medicaid Other

## 2019-03-07 ENCOUNTER — Other Ambulatory Visit: Payer: Self-pay

## 2019-03-07 ENCOUNTER — Encounter (HOSPITAL_COMMUNITY): Payer: Self-pay | Admitting: *Deleted

## 2019-03-07 ENCOUNTER — Inpatient Hospital Stay (HOSPITAL_COMMUNITY)
Admission: AD | Admit: 2019-03-07 | Discharge: 2019-03-10 | DRG: 872 | Disposition: A | Discharge status: Home / Self Care | Payer: Medicaid Other | Attending: Internal Medicine | Admitting: Internal Medicine

## 2019-03-07 DIAGNOSIS — J302 Other seasonal allergic rhinitis: Secondary | ICD-10-CM | POA: Diagnosis present

## 2019-03-07 DIAGNOSIS — E876 Hypokalemia: Secondary | ICD-10-CM | POA: Diagnosis not present

## 2019-03-07 DIAGNOSIS — Z8614 Personal history of Methicillin resistant Staphylococcus aureus infection: Secondary | ICD-10-CM

## 2019-03-07 DIAGNOSIS — Z79899 Other long term (current) drug therapy: Secondary | ICD-10-CM

## 2019-03-07 DIAGNOSIS — F418 Other specified anxiety disorders: Secondary | ICD-10-CM | POA: Diagnosis not present

## 2019-03-07 DIAGNOSIS — Z91018 Allergy to other foods: Secondary | ICD-10-CM

## 2019-03-07 DIAGNOSIS — A419 Sepsis, unspecified organism: Principal | ICD-10-CM

## 2019-03-07 DIAGNOSIS — R109 Unspecified abdominal pain: Secondary | ICD-10-CM

## 2019-03-07 DIAGNOSIS — J4520 Mild intermittent asthma, uncomplicated: Secondary | ICD-10-CM | POA: Diagnosis not present

## 2019-03-07 DIAGNOSIS — N10 Acute tubulo-interstitial nephritis: Secondary | ICD-10-CM | POA: Diagnosis present

## 2019-03-07 DIAGNOSIS — E669 Obesity, unspecified: Secondary | ICD-10-CM | POA: Diagnosis present

## 2019-03-07 DIAGNOSIS — Z6831 Body mass index (BMI) 31.0-31.9, adult: Secondary | ICD-10-CM

## 2019-03-07 DIAGNOSIS — N12 Tubulo-interstitial nephritis, not specified as acute or chronic: Secondary | ICD-10-CM

## 2019-03-07 DIAGNOSIS — Z818 Family history of other mental and behavioral disorders: Secondary | ICD-10-CM

## 2019-03-07 DIAGNOSIS — N390 Urinary tract infection, site not specified: Secondary | ICD-10-CM | POA: Diagnosis not present

## 2019-03-07 LAB — URINALYSIS, ROUTINE W REFLEX MICROSCOPIC
BILIRUBIN URINE: NEGATIVE
Glucose, UA: NEGATIVE mg/dL
Ketones, ur: NEGATIVE mg/dL
NITRITE: NEGATIVE
PH: 6 (ref 5.0–8.0)
Protein, ur: 30 mg/dL — AB
SPECIFIC GRAVITY, URINE: 1.015 (ref 1.005–1.030)

## 2019-03-07 LAB — CBC
HEMATOCRIT: 36.8 % (ref 36.0–46.0)
Hemoglobin: 11.6 g/dL — ABNORMAL LOW (ref 12.0–15.0)
MCH: 27.4 pg (ref 26.0–34.0)
MCHC: 31.5 g/dL (ref 30.0–36.0)
MCV: 86.8 fL (ref 80.0–100.0)
Platelets: 211 10*3/uL (ref 150–400)
RBC: 4.24 MIL/uL (ref 3.87–5.11)
RDW: 14.5 % (ref 11.5–15.5)
WBC: 7.6 10*3/uL (ref 4.0–10.5)
nRBC: 0 % (ref 0.0–0.2)

## 2019-03-07 LAB — COMPREHENSIVE METABOLIC PANEL
ALT: 14 U/L (ref 0–44)
AST: 22 U/L (ref 15–41)
Albumin: 3.5 g/dL (ref 3.5–5.0)
Alkaline Phosphatase: 56 U/L (ref 38–126)
Anion gap: 9 (ref 5–15)
BILIRUBIN TOTAL: 0.6 mg/dL (ref 0.3–1.2)
BUN: 7 mg/dL (ref 6–20)
CHLORIDE: 105 mmol/L (ref 98–111)
CO2: 20 mmol/L — ABNORMAL LOW (ref 22–32)
CREATININE: 0.98 mg/dL (ref 0.44–1.00)
Calcium: 8.7 mg/dL — ABNORMAL LOW (ref 8.9–10.3)
GFR calc Af Amer: >60 mL/min (ref >60)
GLUCOSE: 138 mg/dL — AB (ref 70–99)
Potassium: 3.5 mmol/L (ref 3.5–5.1)
Sodium: 134 mmol/L — ABNORMAL LOW (ref 135–145)
Total Protein: 7.2 g/dL (ref 6.5–8.1)

## 2019-03-07 LAB — LIPASE, BLOOD: LIPASE: 41 U/L (ref 11–51)

## 2019-03-07 LAB — POCT PREGNANCY, URINE: Preg Test, Ur: NEGATIVE

## 2019-03-07 LAB — INFLUENZA PANEL BY PCR (TYPE A & B)
Influenza A By PCR: NEGATIVE
Influenza B By PCR: NEGATIVE

## 2019-03-07 LAB — LACTIC ACID, PLASMA
Lactic Acid, Venous: 2.4 mmol/L (ref 0.5–1.9)
Lactic Acid, Venous: 1.3 mmol/L (ref 0.5–1.9)

## 2019-03-07 MED ORDER — POLYETHYLENE GLYCOL 3350 17 G PO PACK
17.0000 g | PACK | Freq: Every day | ORAL | Status: DC | PRN
  Administered 2019-03-09: 17 g via ORAL
  Filled 2019-03-07 (×2): qty 1

## 2019-03-07 MED ORDER — ONDANSETRON HCL 4 MG PO TABS: 4.0000 mg | ORAL_TABLET | Freq: Four times a day (QID) | ORAL | Status: DC | PRN

## 2019-03-07 MED ORDER — SODIUM CHLORIDE 0.9 % IV SOLN
INTRAVENOUS | Status: AC
  Administered 2019-03-07: via INTRAVENOUS

## 2019-03-07 MED ORDER — ACETAMINOPHEN 325 MG PO TABS
650.0000 mg | ORAL_TABLET | Freq: Once | ORAL | Status: AC
  Administered 2019-03-07: 650 mg via ORAL
  Filled 2019-03-07: qty 2

## 2019-03-07 MED ORDER — SODIUM CHLORIDE 0.9 % IV BOLUS (SEPSIS)
1000.0000 mL | Freq: Once | INTRAVENOUS | Status: AC
  Administered 2019-03-07: 1000 mL via INTRAVENOUS

## 2019-03-07 MED ORDER — IOHEXOL 300 MG/ML  SOLN
100.0000 mL | Freq: Once | INTRAMUSCULAR | Status: AC | PRN
  Administered 2019-03-07: 100 mL via INTRAVENOUS

## 2019-03-07 MED ORDER — ONDANSETRON HCL 4 MG/2ML IJ SOLN
4.0000 mg | Freq: Four times a day (QID) | INTRAMUSCULAR | Status: DC | PRN
  Administered 2019-03-08 – 2019-03-09 (×2): 4 mg via INTRAVENOUS
  Filled 2019-03-07 (×2): qty 2

## 2019-03-07 MED ORDER — ALBUTEROL SULFATE (2.5 MG/3ML) 0.083% IN NEBU: 2.5000 mg | INHALATION_SOLUTION | Freq: Four times a day (QID) | RESPIRATORY_TRACT | Status: DC | PRN

## 2019-03-07 MED ORDER — ACETAMINOPHEN 325 MG PO TABS: 650.0000 mg | ORAL_TABLET | Freq: Four times a day (QID) | ORAL | Status: DC | PRN

## 2019-03-07 MED ORDER — ENOXAPARIN SODIUM 40 MG/0.4ML ~~LOC~~ SOLN
40.0000 mg | SUBCUTANEOUS | Status: DC
  Filled 2019-03-07 (×2): qty 0.4

## 2019-03-07 MED ORDER — SODIUM CHLORIDE 0.9% FLUSH: 3.0000 mL | Freq: Once | INTRAVENOUS | Status: DC

## 2019-03-07 MED ORDER — ONDANSETRON HCL 4 MG/2ML IJ SOLN
4.0000 mg | Freq: Once | INTRAMUSCULAR | Status: AC | PRN
  Administered 2019-03-07: 4 mg via INTRAVENOUS
  Filled 2019-03-07: qty 2

## 2019-03-07 MED ORDER — KETOROLAC TROMETHAMINE 30 MG/ML IJ SOLN
30.0000 mg | Freq: Four times a day (QID) | INTRAMUSCULAR | Status: DC | PRN
  Administered 2019-03-07 – 2019-03-09 (×4): 30 mg via INTRAVENOUS
  Filled 2019-03-07 (×4): qty 1

## 2019-03-07 MED ORDER — MORPHINE SULFATE (PF) 4 MG/ML IV SOLN
4.0000 mg | Freq: Once | INTRAVENOUS | Status: AC
  Administered 2019-03-07: 4 mg via INTRAVENOUS
  Filled 2019-03-07: qty 1

## 2019-03-07 MED ORDER — ACETAMINOPHEN 650 MG RE SUPP: 650.0000 mg | Freq: Four times a day (QID) | RECTAL | Status: DC | PRN

## 2019-03-07 MED ORDER — SERTRALINE HCL 100 MG PO TABS: 100.0000 mg | ORAL_TABLET | Freq: Every day | ORAL | Status: DC

## 2019-03-07 MED ORDER — SODIUM CHLORIDE 0.9 % IV SOLN
1.0000 g | Freq: Once | INTRAVENOUS | Status: AC
  Administered 2019-03-07: 1 g via INTRAVENOUS
  Filled 2019-03-07: qty 10

## 2019-03-07 MED ORDER — HYDROXYZINE HCL 25 MG PO TABS: 25.0000 mg | ORAL_TABLET | Freq: Two times a day (BID) | ORAL | Status: DC | PRN

## 2019-03-07 MED ORDER — SODIUM CHLORIDE 0.9 % IV SOLN
1.0000 g | INTRAVENOUS | Status: DC
  Administered 2019-03-08 – 2019-03-09 (×2): 1 g via INTRAVENOUS
  Filled 2019-03-07 (×2): qty 10

## 2019-03-07 MED ORDER — HYDROCODONE-ACETAMINOPHEN 5-325 MG PO TABS
1.0000 | ORAL_TABLET | ORAL | Status: DC | PRN
  Administered 2019-03-08: 2 via ORAL
  Administered 2019-03-08 (×2): 1 via ORAL
  Administered 2019-03-08: 2 via ORAL
  Administered 2019-03-09: 1 via ORAL
  Filled 2019-03-07 (×2): qty 2
  Filled 2019-03-07 (×3): qty 1

## 2019-03-07 NOTE — ED Notes (Signed)
Pt endorses foul smelling urine.

## 2019-03-07 NOTE — MAU Note (Addendum)
Pt saw PCP yesterday with lower back pain, went to [facility] for XR, negative for kidney stones.  Back pain continues today, also have lower abdominal pain.  Worst pain is L flank.  Hurts to take a deep breath, has been very "gassy."  Is on birth control pills but has been on antibiotics & did not take extra precautions.  Has some pain relief with toradol, can hardly walk without it.
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
## 2019-03-07 NOTE — ED Triage Notes (Signed)
Pt reports left sided flank pain that radiates to her back and abd that started 2 days ago. Pt saw PCP yesterday and was worked up for kidney stones but was negative. Nausea but no vomiting. Pt was seen at [facility] and sent here for further eval, pt not pregnant.

## 2019-03-07 NOTE — [facility] Provider Note (Signed)
First Provider Initiated Contact with Patient 03/07/19 1601      S [name] is a 28 y.o. G2P1001 non-pregnant female who presents to [facility] today with complaint of left flank pain, chills, & generalized abdominal pain.   O BP 130/82 (BP Location: Right Arm)   Pulse (!) 133   Temp 99.1 F (37.3 C) (Oral)   Resp 18   Wt 87.8 kg   LMP 02/21/2019   BMI 30.32 kg/m  Physical Exam  Constitutional: She appears well-developed and well-nourished. She appears distressed.  Respiratory: Effort normal. No respiratory distress.  Skin: She is not diaphoretic.  Psychiatric: She has a normal mood and affect. Her behavior is normal. Thought content normal.    A Non pregnant female Medical screening exam complete 1. Negative pregnancy test   2. Acute left flank pain   3. Abdominal pain in female      P Transfer to [facility] for further evaluation Pt agreeable with plan Report given to [facility], ED RN  Sydney Guild, NP 03/07/2019 4:01 PM

## 2019-03-08 ENCOUNTER — Other Ambulatory Visit: Payer: Self-pay

## 2019-03-08 DIAGNOSIS — N12 Tubulo-interstitial nephritis, not specified as acute or chronic: Secondary | ICD-10-CM | POA: Diagnosis not present

## 2019-03-08 DIAGNOSIS — J4520 Mild intermittent asthma, uncomplicated: Secondary | ICD-10-CM | POA: Insufficient documentation

## 2019-03-08 DIAGNOSIS — R109 Unspecified abdominal pain: Secondary | ICD-10-CM

## 2019-03-08 DIAGNOSIS — F418 Other specified anxiety disorders: Secondary | ICD-10-CM | POA: Diagnosis not present

## 2019-03-08 DIAGNOSIS — Z8709 Personal history of other diseases of the respiratory system: Secondary | ICD-10-CM

## 2019-03-08 DIAGNOSIS — A419 Sepsis, unspecified organism: Secondary | ICD-10-CM | POA: Diagnosis not present

## 2019-03-08 LAB — CBC WITH DIFFERENTIAL/PLATELET
Abs Immature Granulocytes: 0.01 10*3/uL (ref 0.00–0.07)
Basophils Absolute: 0 10*3/uL (ref 0.0–0.1)
Basophils Relative: 1 %
Eosinophils Absolute: 0 10*3/uL (ref 0.0–0.5)
Eosinophils Relative: 0 %
HCT: 30.9 % — ABNORMAL LOW (ref 36.0–46.0)
HEMOGLOBIN: 9.9 g/dL — AB (ref 12.0–15.0)
Immature Granulocytes: 0 %
LYMPHS PCT: 15 %
Lymphs Abs: 0.8 10*3/uL (ref 0.7–4.0)
MCH: 27.3 pg (ref 26.0–34.0)
MCHC: 32 g/dL (ref 30.0–36.0)
MCV: 85.4 fL (ref 80.0–100.0)
Monocytes Absolute: 0.2 10*3/uL (ref 0.1–1.0)
Monocytes Relative: 3 %
Neutro Abs: 4.5 10*3/uL (ref 1.7–7.7)
Neutrophils Relative %: 81 %
Platelets: 174 10*3/uL (ref 150–400)
RBC: 3.62 MIL/uL — ABNORMAL LOW (ref 3.87–5.11)
RDW: 14.6 % (ref 11.5–15.5)
WBC: 5.6 10*3/uL (ref 4.0–10.5)
nRBC: 0 % (ref 0.0–0.2)

## 2019-03-08 LAB — HIV ANTIBODY (ROUTINE TESTING W REFLEX): HIV Screen 4th Generation wRfx: NONREACTIVE

## 2019-03-08 LAB — BASIC METABOLIC PANEL
Anion gap: 6 (ref 5–15)
BUN: 5 mg/dL — ABNORMAL LOW (ref 6–20)
CO2: 20 mmol/L — ABNORMAL LOW (ref 22–32)
Calcium: 7.6 mg/dL — ABNORMAL LOW (ref 8.9–10.3)
Chloride: 111 mmol/L (ref 98–111)
Creatinine, Ser: 0.75 mg/dL (ref 0.44–1.00)
GFR calc Af Amer: >60 mL/min (ref >60)
GFR calc non Af Amer: >60 mL/min (ref >60)
Glucose, Bld: 124 mg/dL — ABNORMAL HIGH (ref 70–99)
Potassium: 3.3 mmol/L — ABNORMAL LOW (ref 3.5–5.1)
Sodium: 137 mmol/L (ref 135–145)

## 2019-03-08 MED ORDER — SERTRALINE HCL 100 MG PO TABS
100.0000 mg | ORAL_TABLET | ORAL | Status: DC
  Administered 2019-03-08 – 2019-03-10 (×2): 100 mg via ORAL
  Filled 2019-03-08 (×2): qty 1

## 2019-03-08 MED ORDER — SODIUM CHLORIDE 0.9 % IV SOLN
INTRAVENOUS | Status: DC
  Administered 2019-03-08 – 2019-03-09 (×2): via INTRAVENOUS

## 2019-03-08 NOTE — Progress Notes (Addendum)
Progress Note    Sydney [name]  JKD:[id number] DOB: 03-May-1991  DOA: 03/07/2019 PCP: Perlie Mayo, NP    Brief Narrative:   Chief complaint: Left-sided flank pain  Medical records reviewed and are as summarized below:  Sydney [name] is an 28 y.o. female with past medical history significant for  Assessment/Plan:   Principal Problem:   Sepsis secondary to UTI [facility]) Active Problems:   Depression with anxiety   1.  Sepsis secondary to pyelonephritis: Acute patient initially found to be febrile with mild tachypnea and tachycardia.  Sepsis protocol initiated with blood cultures obtained, but it appears that urine cultures were not.  Patient was started on empiric antibiotics of Rocephin.  Blood cultures currently negative.   -Follow-up cultures -Continue IV Rocephin x1 more day and likely switch to p.o. antibiotic in a.m. -Continue hydrocodone as needed pain -Tylenol as needed for fever  2.  Depression and anxiety -Continue Zoloft and as needed Vistaril  3.  History of asthma: Stable. -Albuterol inhaler as needed  4.  Obesity:Body mass index is 31.52 kg/m.   Family Communication/Anticipated D/C date and plan/Code Status   DVT prophylaxis: Lovenox ordered. Code Status: Full Code.  Family Communication: No family present at bedside Disposition Plan: Likely discharge home in a.m.   Medical Consultants:    None.   Anti-Infectives:     Rocephin  effective day 2  Subjective:   Patient reports that she still feels very ill, but pain has improved from 9 down to a 6 out of 10 with pain medications.  Objective:    Vitals:   03/07/19 2339 03/07/19 2340 03/08/19 0454 03/08/19 1131  BP: 114/68  113/66 114/67  Pulse: 95  (!) 114 89  Resp: 20  18 19   Temp: 100 F (37.8 C)  (!) 100.6 F (38.1 C) 98.3 F (36.8 C)  TempSrc: Oral  Oral Oral  SpO2: 100%  96% 98%  Weight:  91.3 kg    Height:        Intake/Output Summary (Last 24 hours) at  03/08/2019 1449 Last data filed at 03/08/2019 0998 Gross per 24 hour  Intake 706.22 ml  Output -  Net 706.22 ml   Filed Weights   03/07/19 1529 03/07/19 2026 03/07/19 2340  Weight: 87.8 kg 87.8 kg 91.3 kg    Exam: Constitutional: Young female who appears acutely sick, but able to follow commands  Eyes: PERRL, lids and conjunctivae normal ENMT: Mucous membranes are moist. Posterior pharynx clear of any exudate or lesions. Normal dentition.  Neck: normal, supple, no masses, no thyromegaly Respiratory: clear to auscultation bilaterally, no wheezing, no crackles. Normal respiratory effort. No accessory muscle use.  Cardiovascular: Regular rate and rhythm, no murmurs / rubs / gallops. No extremity edema. 2+ pedal pulses. No carotid bruits.  Abdomen: no tenderness, no masses palpated. No hepatosplenomegaly. Bowel sounds positive.  Musculoskeletal: no clubbing / cyanosis. No joint deformity upper and lower extremities. Good ROM, no contractures. Normal muscle tone.  Skin: no rashes, lesions, ulcers. No induration Neurologic: CN 2-12 grossly intact. Sensation intact, DTR normal. Strength 5/5 in all 4.  Psychiatric: Normal judgment and insight. Alert and oriented x 3. Normal mood.    Data Reviewed:   I have personally reviewed following labs and imaging studies:  Labs: Labs show the following:   Basic Metabolic Panel: Recent Labs  Lab 03/07/19 1740 03/08/19 0037  NA 134* 137  K 3.5 3.3*  CL 105 111  CO2 20* 20*  GLUCOSE 138* 124*  BUN 7 5*  CREATININE 0.98 0.75  CALCIUM 8.7* 7.6*   GFR Estimated Creatinine Clearance: 123.6 mL/min (by C-G formula based on SCr of 0.75 mg/dL). Liver Function Tests: Recent Labs  Lab 03/07/19 1740  AST 22  ALT 14  ALKPHOS 56  BILITOT 0.6  PROT 7.2  ALBUMIN 3.5   Recent Labs  Lab 03/07/19 1740  LIPASE 41   No results for input(s): AMMONIA in the last 168 hours. Coagulation profile No results for input(s): INR, PROTIME in the last 168  hours.  CBC: Recent Labs  Lab 03/07/19 1740 03/08/19 0037  WBC 7.6 5.6  NEUTROABS  --  4.5  HGB 11.6* 9.9*  HCT 36.8 30.9*  MCV 86.8 85.4  PLT 211 174   Cardiac Enzymes: No results for input(s): CKTOTAL, CKMB, CKMBINDEX, TROPONINI in the last 168 hours. BNP (last 3 results) No results for input(s): PROBNP in the last 8760 hours. CBG: No results for input(s): GLUCAP in the last 168 hours. D-Dimer: No results for input(s): DDIMER in the last 72 hours. Hgb A1c: No results for input(s): HGBA1C in the last 72 hours. Lipid Profile: No results for input(s): CHOL, HDL, LDLCALC, TRIG, CHOLHDL, LDLDIRECT in the last 72 hours. Thyroid function studies: No results for input(s): TSH, T4TOTAL, T3FREE, THYROIDAB in the last 72 hours.  Invalid input(s): FREET3 Anemia work up: No results for input(s): VITAMINB12, FOLATE, FERRITIN, TIBC, IRON, RETICCTPCT in the last 72 hours. Sepsis Labs: Recent Labs  Lab 03/07/19 1740 03/07/19 2005 03/08/19 0037  WBC 7.6  --  5.6  LATICACIDVEN 2.4* 1.3  --     Microbiology Recent Results (from the past 240 hour(s))  Blood Culture (routine x 2)     Status: None (Preliminary result)   Collection Time: 03/07/19  8:15 PM  Result Value Ref Range Status   Specimen Description BLOOD RIGHT WRIST  Final   Special Requests   Final    BOTTLES DRAWN AEROBIC AND ANAEROBIC Blood Culture adequate volume   Culture   Final    NO GROWTH < 24 HOURS Performed at [facility], [address], [place], [place] [address]    Report Status PENDING  Incomplete    Procedures and diagnostic studies:  Dg Abd 1 View  Result Date: 03/07/2019 CLINICAL DATA:  Left flank pain EXAM: ABDOMEN - 1 VIEW COMPARISON:  None. FINDINGS: The bowel gas pattern is normal. No radio-opaque calculi or other significant radiographic abnormality are seen. IMPRESSION: Negative. Electronically Signed   By: Donavan Foil M.D.   On: 03/07/2019 03:55   Ct Abdomen Pelvis W  Contrast  Result Date: 03/07/2019 CLINICAL DATA:  Initial evaluation for acute left flank pain, fever. EXAM: CT ABDOMEN AND PELVIS WITH CONTRAST TECHNIQUE: Multidetector CT imaging of the abdomen and pelvis was performed using the standard protocol following bolus administration of intravenous contrast. CONTRAST:  113mL OMNIPAQUE IOHEXOL 300 MG/ML  SOLN COMPARISON:  Prior radiograph from 03/06/2019. FINDINGS: Lower chest: Visualized lung bases are clear. Hepatobiliary: Liver demonstrates a normal contrast enhanced appearance. Gallbladder normal. No biliary dilatation. Pancreas: Pancreas within normal limits. Spleen: Spleen within normal limits. Adrenals/Urinary Tract: Adrenal glands are normal. Left kidney mildly enlarged as compared to the right with slightly delayed and striated nephrogram, suggesting acute pyelonephritis. Subtle left-sided perinephric fat stranding. No associated abscess or other collection. No nephrolithiasis or hydronephrosis. No hydroureter. Few small subcentimeter hypodensities within the left kidney are too small the characterize, but statistically likely reflects small cyst.  Bladder largely decompressed without acute finding. Left ureteral jet noted within the bladder lumen. Stomach/Bowel: Stomach within normal limits. No evidence for bowel obstruction. No acute inflammatory changes seen about the bowels. Normal appendix. Vascular/Lymphatic: Normal intravascular enhancement seen throughout the intra-abdominal aorta and its branch vessels. No adenopathy. Reproductive: Uterus and ovaries within normal limits. Note made of a small 18 mm right ovarian cyst, likely a physiologic follicular cyst. Other: No free air or fluid. Musculoskeletal: No acute osseous abnormality. No discrete lytic or blastic osseous lesions. IMPRESSION: 1. Subtle changes within the left kidney compatible with acute pyelonephritis. No associated abscess, hydronephrosis, or other complication. 2. No other acute  intra-abdominal or pelvic process. Electronically Signed   By: Jeannine Boga M.D.   On: 03/07/2019 21:18    Medications:   . enoxaparin (LOVENOX) injection  40 mg Subcutaneous Q24H  . sertraline  100 mg Oral QODAY  . sodium chloride flush  3 mL Intravenous Once   Continuous Infusions: . sodium chloride    . cefTRIAXone (ROCEPHIN)  IV       LOS: 0 days   Rondell A [name]  [facility]   *Please refer to [email or web address], password [email or web address] to get updated schedule on who will round on this patient, as hospitalists switch teams weekly. If 7PM-7AM, please contact night-coverage at [email or web address], password [email or web address] for any overnight needs.

## 2019-03-08 NOTE — Progress Notes (Signed)
Patient's ex husband came in uninvited .  His name is Kyra Leyland.  Tall with curly hair.  Pt does not want him to visit.

## 2019-03-09 DIAGNOSIS — Z6831 Body mass index (BMI) 31.0-31.9, adult: Secondary | ICD-10-CM | POA: Diagnosis not present

## 2019-03-09 DIAGNOSIS — Z91018 Allergy to other foods: Secondary | ICD-10-CM | POA: Diagnosis not present

## 2019-03-09 DIAGNOSIS — E876 Hypokalemia: Secondary | ICD-10-CM | POA: Diagnosis not present

## 2019-03-09 DIAGNOSIS — Z79899 Other long term (current) drug therapy: Secondary | ICD-10-CM | POA: Diagnosis not present

## 2019-03-09 DIAGNOSIS — J4520 Mild intermittent asthma, uncomplicated: Secondary | ICD-10-CM | POA: Diagnosis present

## 2019-03-09 DIAGNOSIS — J302 Other seasonal allergic rhinitis: Secondary | ICD-10-CM | POA: Diagnosis present

## 2019-03-09 DIAGNOSIS — Z818 Family history of other mental and behavioral disorders: Secondary | ICD-10-CM | POA: Diagnosis not present

## 2019-03-09 DIAGNOSIS — Z8614 Personal history of Methicillin resistant Staphylococcus aureus infection: Secondary | ICD-10-CM | POA: Diagnosis not present

## 2019-03-09 DIAGNOSIS — N390 Urinary tract infection, site not specified: Secondary | ICD-10-CM | POA: Diagnosis not present

## 2019-03-09 DIAGNOSIS — N12 Tubulo-interstitial nephritis, not specified as acute or chronic: Secondary | ICD-10-CM | POA: Diagnosis present

## 2019-03-09 DIAGNOSIS — E669 Obesity, unspecified: Secondary | ICD-10-CM | POA: Diagnosis present

## 2019-03-09 DIAGNOSIS — F418 Other specified anxiety disorders: Secondary | ICD-10-CM | POA: Diagnosis present

## 2019-03-09 DIAGNOSIS — A419 Sepsis, unspecified organism: Secondary | ICD-10-CM | POA: Diagnosis present

## 2019-03-09 DIAGNOSIS — N10 Acute tubulo-interstitial nephritis: Secondary | ICD-10-CM | POA: Diagnosis present

## 2019-03-09 MED ORDER — ACETAMINOPHEN 500 MG PO TABS
1000.0000 mg | ORAL_TABLET | Freq: Four times a day (QID) | ORAL | Status: DC
  Administered 2019-03-09 – 2019-03-10 (×4): 1000 mg via ORAL
  Filled 2019-03-09 (×5): qty 2

## 2019-03-09 MED ORDER — SODIUM CHLORIDE 0.9 % IV SOLN
INTRAVENOUS | Status: DC
  Administered 2019-03-10: 03:00:00 via INTRAVENOUS

## 2019-03-09 MED ORDER — POTASSIUM CHLORIDE CRYS ER 20 MEQ PO TBCR
40.0000 meq | EXTENDED_RELEASE_TABLET | Freq: Once | ORAL | Status: AC
  Administered 2019-03-09: 40 meq via ORAL
  Filled 2019-03-09: qty 2

## 2019-03-09 MED ORDER — SENNA 8.6 MG PO TABS
1.0000 | ORAL_TABLET | Freq: Every day | ORAL | Status: DC
  Administered 2019-03-09 – 2019-03-10 (×2): 8.6 mg via ORAL
  Filled 2019-03-09 (×2): qty 1

## 2019-03-09 MED ORDER — IBUPROFEN 600 MG PO TABS: 600.0000 mg | ORAL_TABLET | Freq: Four times a day (QID) | ORAL | Status: DC | PRN

## 2019-03-09 NOTE — Plan of Care (Signed)
  Problem: Education: Goal: Knowledge of General Education information will improve Description Including pain rating scale, medication(s)/side effects and non-pharmacologic comfort measures Outcome: Progressing   Problem: Health Behavior/Discharge Planning: Goal: Ability to manage health-related needs will improve Outcome: Progressing   Problem: Clinical Measurements: Goal: Ability to maintain clinical measurements within normal limits will improve Outcome: Progressing Goal: Will remain free from infection Outcome: Progressing Goal: Diagnostic test results will improve Outcome: Progressing   Problem: Activity: Goal: Risk for activity intolerance will decrease Outcome: Progressing   Problem: Nutrition: Goal: Adequate nutrition will be maintained Outcome: Progressing   Problem: Coping: Goal: Level of anxiety will decrease Outcome: Progressing   Problem: Elimination: Goal: Will not experience complications related to urinary retention Outcome: Progressing   Problem: Pain Managment: Goal: General experience of comfort will improve Outcome: Progressing   Problem: Safety: Goal: Ability to remain free from injury will improve Outcome: Progressing   Problem: Skin Integrity: Goal: Risk for impaired skin integrity will decrease Outcome: Progressing   

## 2019-03-09 NOTE — Progress Notes (Signed)
Pt no longer nauseous.  Ate a little dinner.  Pain at tolerable level.

## 2019-03-09 NOTE — Progress Notes (Signed)
Progress Note    Sydney [name]  LOV:[id number] DOB: 1991/06/04  DOA: 03/07/2019 PCP: Sydney Mayo, NP    Brief Narrative:     Medical records reviewed and are as summarized below:  Sydney [name] is an 28 y.o. female here with pyelonephritis.  Assessment/Plan:   Principal Problem:   Sepsis secondary to UTI [facility]) Active Problems:   Depression with anxiety  1.  Sepsis secondary to pyelonephritis: Acute -blood cultures NGTD -urine culture with gram negative rods -Rocephin- IV As patient not eating well so not sure she would be able to tolerate PO abx at this point--- will continue IVF as well and monitor intake -Follow-up cultures -schedule tylenol for pain -avoid narcotics  2.  Depression and anxiety -Continue Zoloft and as needed Vistaril  3.  History of asthma: Stable. -Albuterol inhaler as needed  4.  Obesity:Body mass index is 31.52 kg/m.  5. Hypokalemia -replete  Family Communication/Anticipated D/C date and plan/Code Status   DVT prophylaxis: Lovenox ordered. Code Status: Full Code.  Family Communication:  Disposition Plan: continue IVF and IV Abx- await final urine culture and tolerance of PO intake   Medical Consultants:    None.   Anti-Infectives:    None  Subjective:   Per nursing, not able to eat breakfast this AM  Objective:    Vitals:   03/08/19 1500 03/09/19 0003 03/09/19 0526 03/09/19 1122  BP: 131/87 120/68 102/64 110/75  Pulse: (!) 101 61 61 73  Resp: 18 18 18 18   Temp: 99.6 F (37.6 C) 98.1 F (36.7 C) 97.9 F (36.6 C) 98.1 F (36.7 C)  TempSrc: Oral Oral Oral Oral  SpO2: 99% 98% 99% 98%  Weight:      Height:        Intake/Output Summary (Last 24 hours) at 03/09/2019 1158 Last data filed at 03/09/2019 0721 Gross per 24 hour  Intake 1481.1 ml  Output -  Net 1481.1 ml   Filed Weights   03/07/19 1529 03/07/19 2026 03/07/19 2340  Weight: 87.8 kg 87.8 kg 91.3 kg    Exam: In bed, ill appearing  rrr +BS, soft, tender all over No LE edema  A+OX3  Data Reviewed:   I have personally reviewed following labs and imaging studies:  Labs: Labs show the following:   Basic Metabolic Panel: Recent Labs  Lab 03/07/19 1740 03/08/19 0037  NA 134* 137  K 3.5 3.3*  CL 105 111  CO2 20* 20*  GLUCOSE 138* 124*  BUN 7 5*  CREATININE 0.98 0.75  CALCIUM 8.7* 7.6*   GFR Estimated Creatinine Clearance: 123.6 mL/min (by C-G formula based on SCr of 0.75 mg/dL). Liver Function Tests: Recent Labs  Lab 03/07/19 1740  AST 22  ALT 14  ALKPHOS 56  BILITOT 0.6  PROT 7.2  ALBUMIN 3.5   Recent Labs  Lab 03/07/19 1740  LIPASE 41   No results for input(s): AMMONIA in the last 168 hours. Coagulation profile No results for input(s): INR, PROTIME in the last 168 hours.  CBC: Recent Labs  Lab 03/07/19 1740 03/08/19 0037  WBC 7.6 5.6  NEUTROABS  --  4.5  HGB 11.6* 9.9*  HCT 36.8 30.9*  MCV 86.8 85.4  PLT 211 174   Cardiac Enzymes: No results for input(s): CKTOTAL, CKMB, CKMBINDEX, TROPONINI in the last 168 hours. BNP (last 3 results) No results for input(s): PROBNP in the last 8760 hours. CBG: No results for input(s): GLUCAP in the last 168 hours. D-Dimer:  No results for input(s): DDIMER in the last 72 hours. Hgb A1c: No results for input(s): HGBA1C in the last 72 hours. Lipid Profile: No results for input(s): CHOL, HDL, LDLCALC, TRIG, CHOLHDL, LDLDIRECT in the last 72 hours. Thyroid function studies: No results for input(s): TSH, T4TOTAL, T3FREE, THYROIDAB in the last 72 hours.  Invalid input(s): FREET3 Anemia work up: No results for input(s): VITAMINB12, FOLATE, FERRITIN, TIBC, IRON, RETICCTPCT in the last 72 hours. Sepsis Labs: Recent Labs  Lab 03/07/19 1740 03/07/19 2005 03/08/19 0037  WBC 7.6  --  5.6  LATICACIDVEN 2.4* 1.3  --     Microbiology Recent Results (from the past 240 hour(s))  Urine culture     Status: Abnormal (Preliminary result)    Collection Time: 03/07/19  3:38 PM  Result Value Ref Range Status   Specimen Description URINE, RANDOM  Final   Special Requests   Final    NONE Performed at [facility], [address], [place], [place] [address]    Culture >=100,000 COLONIES/mL GRAM NEGATIVE RODS (A)  Final   Report Status PENDING  Incomplete  Blood Culture (routine x 2)     Status: None (Preliminary result)   Collection Time: 03/07/19  8:15 PM  Result Value Ref Range Status   Specimen Description BLOOD RIGHT WRIST  Final   Special Requests   Final    BOTTLES DRAWN AEROBIC AND ANAEROBIC Blood Culture adequate volume   Culture   Final    NO GROWTH < 24 HOURS Performed at [facility], [place] [address] [place], [place] [address]    Report Status PENDING  Incomplete    Procedures and diagnostic studies:  Ct Abdomen Pelvis W Contrast  Result Date: 03/07/2019 CLINICAL DATA:  Initial evaluation for acute left flank pain, fever. EXAM: CT ABDOMEN AND PELVIS WITH CONTRAST TECHNIQUE: Multidetector CT imaging of the abdomen and pelvis was performed using the standard protocol following bolus administration of intravenous contrast. CONTRAST:  155mL OMNIPAQUE IOHEXOL 300 MG/ML  SOLN COMPARISON:  Prior radiograph from 03/06/2019. FINDINGS: Lower chest: Visualized lung bases are clear. Hepatobiliary: Liver demonstrates a normal contrast enhanced appearance. Gallbladder normal. No biliary dilatation. Pancreas: Pancreas within normal limits. Spleen: Spleen within normal limits. Adrenals/Urinary Tract: Adrenal glands are normal. Left kidney mildly enlarged as compared to the right with slightly delayed and striated nephrogram, suggesting acute pyelonephritis. Subtle left-sided perinephric fat stranding. No associated abscess or other collection. No nephrolithiasis or hydronephrosis. No hydroureter. Few small subcentimeter hypodensities within the left kidney are too small the characterize, but statistically likely reflects  small cyst. Bladder largely decompressed without acute finding. Left ureteral jet noted within the bladder lumen. Stomach/Bowel: Stomach within normal limits. No evidence for bowel obstruction. No acute inflammatory changes seen about the bowels. Normal appendix. Vascular/Lymphatic: Normal intravascular enhancement seen throughout the intra-abdominal aorta and its branch vessels. No adenopathy. Reproductive: Uterus and ovaries within normal limits. Note made of a small 18 mm right ovarian cyst, likely a physiologic follicular cyst. Other: No free air or fluid. Musculoskeletal: No acute osseous abnormality. No discrete lytic or blastic osseous lesions. IMPRESSION: 1. Subtle changes within the left kidney compatible with acute pyelonephritis. No associated abscess, hydronephrosis, or other complication. 2. No other acute intra-abdominal or pelvic process. Electronically Signed   By: Jeannine Boga M.D.   On: 03/07/2019 21:18    Medications:   . acetaminophen  1,000 mg Oral Q6H WA  . enoxaparin (LOVENOX) injection  40 mg Subcutaneous Q24H  .  senna  1 tablet Oral Daily  . sertraline  100 mg Oral QODAY  . sodium chloride flush  3 mL Intravenous Once   Continuous Infusions: . sodium chloride 100 mL/hr at 03/09/19 0721  . cefTRIAXone (ROCEPHIN)  IV Stopped (03/08/19 2223)     LOS: 0 days   Geradine Girt  [facility]   How to contact the [facility] Attending or Consulting provider [place] or covering provider during after hours [place], for this patient?  1. Check the care team in [facility] and look for a) attending/consulting [facility] provider listed and b) the [facility] team listed 2. Log into [email or web address] and use [place]'s universal password to access. If you do not have the password, please contact the hospital operator. 3. Locate the [facility] provider you are looking for under [facility] and page to a number that you can be directly reached. 4. If you still have difficulty reaching the provider,  please page the [facility] (Director on Call) for the Hospitalists listed on amion for assistance.  03/09/2019, 11:58 AM

## 2019-03-10 LAB — URINE CULTURE: Culture: >=100000 — AB

## 2019-03-10 MED ORDER — CEPHALEXIN 500 MG PO CAPS: 500.0000 mg | ORAL_CAPSULE | Freq: Four times a day (QID) | ORAL | 0 refills | Status: AC

## 2019-03-10 NOTE — Progress Notes (Signed)
Sydney [name] to be D/C'd  per MD order. Discussed with the patient and all questions fully answered.  VSS, Skin clean, dry and intact without evidence of skin break down, no evidence of skin tears noted.  IV catheter discontinued intact. Site without signs and symptoms of complications. Dressing and pressure applied.  An After Visit Summary was printed and given to the patient. Patient received prescription.  D/c education completed with patient/family including follow up instructions, medication list, d/c activities limitations if indicated, with other d/c instructions as indicated by MD - patient able to verbalize understanding, all questions fully answered.   Patient instructed to return to ED, call 911, or call MD for any changes in condition.   Patient preferred to walk to private auto upon D/C home.

## 2019-03-10 NOTE — Discharge Summary (Signed)
Physician Discharge Summary  THERMA LASURE OHY:[id number] DOB: 1991-06-06 DOA: 03/07/2019  PCP: Perlie Mayo, NP  Admit date: 03/07/2019 Discharge date: 03/10/2019  Admitted From: home Disposition:  Home  Recommendations for Outpatient Follow-up:  1. Follow up with PCP in 1-2 weeks Home Health:No Equipment/Devices:None  Discharge Condition:stable CODE STATUS:Full Diet recommendation: Heart Healthy  Brief/Interim Summary: You may copy/paste interim summary or write brief hospital course depending on length of stay  Discharge Diagnoses:  Principal Problem:   Sepsis secondary to UTI [facility]) Active Problems:   Depression with anxiety  She started empirically on IV Rocephin, urine cultures showed E. coli sensitive to Keflex. Blood cultures remain negative. She was able to tolerate her diet. Walking around ambulating the halls. She was changed to oral Keflex which she will continue for 5 additional days and outpatient.  Depression with anxiety: Regiment changes made to her medication.  Discharge Instructions  Discharge Instructions    Diet - low sodium heart healthy   Complete by:  As directed    Increase activity slowly   Complete by:  As directed      Allergies as of 03/10/2019      Reactions   Apple Anaphylaxis   Cherry Anaphylaxis   Fruit Extracts Itching, Swelling   Pt states it happens with most fruits   Black Walnut Pollen Allergy Skin Test Itching   Almonds      Medication List    TAKE these medications   albuterol 108 (90 Base) MCG/ACT inhaler Commonly known as:  PROVENTIL HFA;VENTOLIN HFA Inhale 1 puff into the lungs every 6 (six) hours as needed for wheezing or shortness of breath.   cephALEXin 500 MG capsule Commonly known as:  KEFLEX Take 1 capsule (500 mg total) by mouth 4 (four) times daily for 5 days.   Enskyce 0.15-30 MG-MCG tablet Generic drug:  desogestrel-ethinyl estradiol Take 1 tablet by mouth daily.   FLUoxetine 20 MG  tablet Commonly known as:  PROZAC Take 1 tablet (20 mg total) by mouth daily.   hydrOXYzine 25 MG tablet Commonly known as:  ATARAX/VISTARIL Take 1 tablet (25 mg total) by mouth 2 (two) times daily as needed for anxiety.   ketorolac 10 MG tablet Commonly known as:  TORADOL Take 1 tablet (10 mg total) by mouth every 6 (six) hours as needed for moderate pain or severe pain.   sertraline 100 MG tablet Commonly known as:  ZOLOFT Take 100 mg by mouth daily.       Allergies  Allergen Reactions  . Apple Anaphylaxis  . Cherry Anaphylaxis  . Fruit Extracts Itching and Swelling    Pt states it happens with most fruits  . Black Walnut Pollen Allergy Skin Test Itching    Almonds    Consultations:  None   Procedures/Studies: Dg Abd 1 View  Result Date: 03/07/2019 CLINICAL DATA:  Left flank pain EXAM: ABDOMEN - 1 VIEW COMPARISON:  None. FINDINGS: The bowel gas pattern is normal. No radio-opaque calculi or other significant radiographic abnormality are seen. IMPRESSION: Negative. Electronically Signed   By: Donavan Foil M.D.   On: 03/07/2019 03:55   Ct Abdomen Pelvis W Contrast  Result Date: 03/07/2019 CLINICAL DATA:  Initial evaluation for acute left flank pain, fever. EXAM: CT ABDOMEN AND PELVIS WITH CONTRAST TECHNIQUE: Multidetector CT imaging of the abdomen and pelvis was performed using the standard protocol following bolus administration of intravenous contrast. CONTRAST:  185mL OMNIPAQUE IOHEXOL 300 MG/ML  SOLN COMPARISON:  Prior radiograph from 03/06/2019.  FINDINGS: Lower chest: Visualized lung bases are clear. Hepatobiliary: Liver demonstrates a normal contrast enhanced appearance. Gallbladder normal. No biliary dilatation. Pancreas: Pancreas within normal limits. Spleen: Spleen within normal limits. Adrenals/Urinary Tract: Adrenal glands are normal. Left kidney mildly enlarged as compared to the right with slightly delayed and striated nephrogram, suggesting acute pyelonephritis.  Subtle left-sided perinephric fat stranding. No associated abscess or other collection. No nephrolithiasis or hydronephrosis. No hydroureter. Few small subcentimeter hypodensities within the left kidney are too small the characterize, but statistically likely reflects small cyst. Bladder largely decompressed without acute finding. Left ureteral jet noted within the bladder lumen. Stomach/Bowel: Stomach within normal limits. No evidence for bowel obstruction. No acute inflammatory changes seen about the bowels. Normal appendix. Vascular/Lymphatic: Normal intravascular enhancement seen throughout the intra-abdominal aorta and its branch vessels. No adenopathy. Reproductive: Uterus and ovaries within normal limits. Note made of a small 18 mm right ovarian cyst, likely a physiologic follicular cyst. Other: No free air or fluid. Musculoskeletal: No acute osseous abnormality. No discrete lytic or blastic osseous lesions. IMPRESSION: 1. Subtle changes within the left kidney compatible with acute pyelonephritis. No associated abscess, hydronephrosis, or other complication. 2. No other acute intra-abdominal or pelvic process. Electronically Signed   By: Jeannine Boga M.D.   On: 03/07/2019 21:18     Subjective: No new complaints.  Discharge Exam: Vitals:   03/09/19 2026 03/10/19 0514  BP: 112/67 112/73  Pulse: 65 60  Resp: 18 17  Temp: 98.4 F (36.9 C) 98.5 F (36.9 C)  SpO2: 100% 100%     General: Pt is alert, awake, not in acute distress Cardiovascular: RRR, S1/S2 +, no rubs, no gallops Respiratory: CTA bilaterally, no wheezing, no rhonchi Abdominal: Soft, NT, ND, bowel sounds + Extremities: no edema, no cyanosis    The results of significant diagnostics from this hospitalization (including imaging, microbiology, ancillary and laboratory) are listed below for reference.     Microbiology: Recent Results (from the past 240 hour(s))  Urine culture     Status: Abnormal   Collection Time:  03/07/19  3:38 PM  Result Value Ref Range Status   Specimen Description URINE, RANDOM  Final   Special Requests   Final    NONE Performed at [facility], [address] [place],  [address]    Culture >=100,000 COLONIES/mL ESCHERICHIA COLI (A)  Final   Report Status 03/10/2019 FINAL  Final   Organism ID, Bacteria ESCHERICHIA COLI (A)  Final      Susceptibility   Escherichia coli - MIC*    AMPICILLIN <=2 SENSITIVE Sensitive     CEFAZOLIN <=4 SENSITIVE Sensitive     CEFTRIAXONE <=1 SENSITIVE Sensitive     CIPROFLOXACIN <=0.25 SENSITIVE Sensitive     GENTAMICIN <=1 SENSITIVE Sensitive     IMIPENEM <=0.25 SENSITIVE Sensitive     NITROFURANTOIN 64 INTERMEDIATE Intermediate     TRIMETH/SULFA <=20 SENSITIVE Sensitive     AMPICILLIN/SULBACTAM <=2 SENSITIVE Sensitive     PIP/TAZO <=4 SENSITIVE Sensitive     Extended ESBL NEGATIVE Sensitive     * >=100,000 COLONIES/mL ESCHERICHIA COLI  Blood Culture (routine x 2)     Status: None (Preliminary result)   Collection Time: 03/07/19  8:15 PM  Result Value Ref Range Status   Specimen Description BLOOD RIGHT WRIST  Final   Special Requests   Final    BOTTLES DRAWN AEROBIC AND ANAEROBIC Blood Culture adequate volume   Culture   Final    NO GROWTH  2 DAYS Performed at [facility], [place] [address] [place], [place] [address]    Report Status PENDING  Incomplete  Blood Culture (routine x 2)     Status: None (Preliminary result)   Collection Time: 03/08/19 12:43 AM  Result Value Ref Range Status   Specimen Description BLOOD LEFT HAND  Final   Special Requests   Final    BOTTLES DRAWN AEROBIC AND ANAEROBIC Blood Culture adequate volume   Culture   Final    NO GROWTH 1 DAY Performed at [facility], [place] [address] [place], [place] [address]    Report Status PENDING  Incomplete     Labs: BNP (last 3 results) No results for input(s): BNP in the last 8760 hours. Basic Metabolic Panel: Recent Labs  Lab  03/07/19 1740 03/08/19 0037  NA 134* 137  K 3.5 3.3*  CL 105 111  CO2 20* 20*  GLUCOSE 138* 124*  BUN 7 5*  CREATININE 0.98 0.75  CALCIUM 8.7* 7.6*   Liver Function Tests: Recent Labs  Lab 03/07/19 1740  AST 22  ALT 14  ALKPHOS 56  BILITOT 0.6  PROT 7.2  ALBUMIN 3.5   Recent Labs  Lab 03/07/19 1740  LIPASE 41   No results for input(s): AMMONIA in the last 168 hours. CBC: Recent Labs  Lab 03/07/19 1740 03/08/19 0037  WBC 7.6 5.6  NEUTROABS  --  4.5  HGB 11.6* 9.9*  HCT 36.8 30.9*  MCV 86.8 85.4  PLT 211 174   Cardiac Enzymes: No results for input(s): CKTOTAL, CKMB, CKMBINDEX, TROPONINI in the last 168 hours. BNP: Invalid input(s): POCBNP CBG: No results for input(s): GLUCAP in the last 168 hours. D-Dimer No results for input(s): DDIMER in the last 72 hours. Hgb A1c No results for input(s): HGBA1C in the last 72 hours. Lipid Profile No results for input(s): CHOL, HDL, LDLCALC, TRIG, CHOLHDL, LDLDIRECT in the last 72 hours. Thyroid function studies No results for input(s): TSH, T4TOTAL, T3FREE, THYROIDAB in the last 72 hours.  Invalid input(s): FREET3 Anemia work up No results for input(s): VITAMINB12, FOLATE, FERRITIN, TIBC, IRON, RETICCTPCT in the last 72 hours. Urinalysis    Component Value Date/Time   COLORURINE YELLOW 03/07/2019 1537   APPEARANCEUR HAZY (A) 03/07/2019 1537   LABSPEC 1.015 03/07/2019 1537   PHURINE 6.0 03/07/2019 1537   GLUCOSEU NEGATIVE 03/07/2019 1537   HGBUR SMALL (A) 03/07/2019 1537   BILIRUBINUR NEGATIVE 03/07/2019 1537   BILIRUBINUR neg 12/01/2018 0847   KETONESUR NEGATIVE 03/07/2019 1537   PROTEINUR 30 (A) 03/07/2019 1537   UROBILINOGEN 0.2 12/01/2018 0847   UROBILINOGEN 0.2 11/09/2014 2202   NITRITE NEGATIVE 03/07/2019 1537   LEUKOCYTESUR SMALL (A) 03/07/2019 1537   Sepsis Labs Invalid input(s): PROCALCITONIN,  WBC,  LACTICIDVEN Microbiology Recent Results (from the past 240 hour(s))  Urine culture     Status:  Abnormal   Collection Time: 03/07/19  3:38 PM  Result Value Ref Range Status   Specimen Description URINE, RANDOM  Final   Special Requests   Final    NONE Performed at [facility], [place] [address] [place], [place] [address]    Culture >=100,000 COLONIES/mL ESCHERICHIA COLI (A)  Final   Report Status 03/10/2019 FINAL  Final   Organism ID, Bacteria ESCHERICHIA COLI (A)  Final      Susceptibility   Escherichia coli - MIC*    AMPICILLIN <=2 SENSITIVE Sensitive     CEFAZOLIN <=4 SENSITIVE Sensitive  CEFTRIAXONE <=1 SENSITIVE Sensitive     CIPROFLOXACIN <=0.25 SENSITIVE Sensitive     GENTAMICIN <=1 SENSITIVE Sensitive     IMIPENEM <=0.25 SENSITIVE Sensitive     NITROFURANTOIN 64 INTERMEDIATE Intermediate     TRIMETH/SULFA <=20 SENSITIVE Sensitive     AMPICILLIN/SULBACTAM <=2 SENSITIVE Sensitive     PIP/TAZO <=4 SENSITIVE Sensitive     Extended ESBL NEGATIVE Sensitive     * >=100,000 COLONIES/mL ESCHERICHIA COLI  Blood Culture (routine x 2)     Status: None (Preliminary result)   Collection Time: 03/07/19  8:15 PM  Result Value Ref Range Status   Specimen Description BLOOD RIGHT WRIST  Final   Special Requests   Final    BOTTLES DRAWN AEROBIC AND ANAEROBIC Blood Culture adequate volume   Culture   Final    NO GROWTH 2 DAYS Performed at [facility], [address] [place], [place] [address]    Report Status PENDING  Incomplete  Blood Culture (routine x 2)     Status: None (Preliminary result)   Collection Time: 03/08/19 12:43 AM  Result Value Ref Range Status   Specimen Description BLOOD LEFT HAND  Final   Special Requests   Final    BOTTLES DRAWN AEROBIC AND ANAEROBIC Blood Culture adequate volume   Culture   Final    NO GROWTH 1 DAY Performed at [facility], [place] [address] [place], [place] [address]    Report Status PENDING  Incomplete     Time coordinating discharge: 40 minutes  SIGNED:   Charlynne Cousins, MD  [facility]

## 2019-03-12 LAB — CULTURE, BLOOD (ROUTINE X 2)
Culture: NO GROWTH
Special Requests: ADEQUATE

## 2019-03-13 ENCOUNTER — Telehealth: Payer: Self-pay

## 2019-03-13 LAB — CULTURE, BLOOD (ROUTINE X 2)
Culture: NO GROWTH
Special Requests: ADEQUATE

## 2019-03-13 NOTE — Telephone Encounter (Signed)
[id number] Attempted to contact patient to complete [facility] telephone call. No answer. Left message requesting call back. 3rd attempt

## 2019-03-13 NOTE — Telephone Encounter (Signed)
[id number]  Attempted to contact patient to complete [facility] telephone call. No answer. Left message requesting call back. Will try again later. 1st attempt

## 2019-03-13 NOTE — Telephone Encounter (Signed)
[id number]  Attempted to contact patient to complete [facility] telephone call. No answer. Left message requesting call back. Will try again later. 2nd attempt

## 2019-03-14 ENCOUNTER — Other Ambulatory Visit: Payer: Self-pay

## 2019-03-14 ENCOUNTER — Emergency Department (HOSPITAL_COMMUNITY)
Admission: EM | Admit: 2019-03-14 | Discharge: 2019-03-14 | Disposition: A | Discharge status: Home / Self Care | Payer: Medicaid Other | Attending: Emergency Medicine | Admitting: Emergency Medicine

## 2019-03-14 ENCOUNTER — Encounter (HOSPITAL_COMMUNITY): Payer: Self-pay | Admitting: Emergency Medicine

## 2019-03-14 DIAGNOSIS — J45909 Unspecified asthma, uncomplicated: Secondary | ICD-10-CM | POA: Diagnosis not present

## 2019-03-14 DIAGNOSIS — R109 Unspecified abdominal pain: Secondary | ICD-10-CM | POA: Diagnosis not present

## 2019-03-14 DIAGNOSIS — Z79899 Other long term (current) drug therapy: Secondary | ICD-10-CM | POA: Insufficient documentation

## 2019-03-14 LAB — CBC WITH DIFFERENTIAL/PLATELET
Abs Immature Granulocytes: 0.04 10*3/uL (ref 0.00–0.07)
BASOS ABS: 0.1 10*3/uL (ref 0.0–0.1)
Basophils Relative: 1 %
EOS PCT: 3 %
Eosinophils Absolute: 0.1 10*3/uL (ref 0.0–0.5)
HCT: 38.2 % (ref 36.0–46.0)
Hemoglobin: 11.9 g/dL — ABNORMAL LOW (ref 12.0–15.0)
Immature Granulocytes: 1 %
Lymphocytes Relative: 46 %
Lymphs Abs: 1.6 10*3/uL (ref 0.7–4.0)
MCH: 27 pg (ref 26.0–34.0)
MCHC: 31.2 g/dL (ref 30.0–36.0)
MCV: 86.6 fL (ref 80.0–100.0)
MONO ABS: 0.3 10*3/uL (ref 0.1–1.0)
Monocytes Relative: 8 %
Neutro Abs: 1.4 10*3/uL — ABNORMAL LOW (ref 1.7–7.7)
Neutrophils Relative %: 41 %
Platelets: 288 10*3/uL (ref 150–400)
RBC: 4.41 MIL/uL (ref 3.87–5.11)
RDW: 14 % (ref 11.5–15.5)
WBC: 3.5 10*3/uL — AB (ref 4.0–10.5)
nRBC: 0 % (ref 0.0–0.2)

## 2019-03-14 LAB — URINALYSIS, ROUTINE W REFLEX MICROSCOPIC
Bilirubin Urine: NEGATIVE
Glucose, UA: NEGATIVE mg/dL
Hgb urine dipstick: NEGATIVE
Ketones, ur: NEGATIVE mg/dL
Leukocytes,Ua: NEGATIVE
Nitrite: NEGATIVE
Protein, ur: NEGATIVE mg/dL
Specific Gravity, Urine: 1.014 (ref 1.005–1.030)
pH: 5 (ref 5.0–8.0)

## 2019-03-14 LAB — LACTIC ACID, PLASMA: Lactic Acid, Venous: 1.6 mmol/L (ref 0.5–1.9)

## 2019-03-14 LAB — BASIC METABOLIC PANEL
Anion gap: 9 (ref 5–15)
BUN: 9 mg/dL (ref 6–20)
CO2: 23 mmol/L (ref 22–32)
Calcium: 9.1 mg/dL (ref 8.9–10.3)
Chloride: 107 mmol/L (ref 98–111)
Creatinine, Ser: 0.75 mg/dL (ref 0.44–1.00)
GFR calc Af Amer: >60 mL/min (ref >60)
GFR calc non Af Amer: >60 mL/min (ref >60)
Glucose, Bld: 83 mg/dL (ref 70–99)
Potassium: 3.5 mmol/L (ref 3.5–5.1)
Sodium: 139 mmol/L (ref 135–145)

## 2019-03-14 MED ORDER — SODIUM CHLORIDE 0.9 % IV BOLUS
500.0000 mL | Freq: Once | INTRAVENOUS | Status: AC
  Administered 2019-03-14: 500 mL via INTRAVENOUS

## 2019-03-14 MED ORDER — LORAZEPAM 2 MG/ML IJ SOLN: 1.0000 mg | Freq: Once | INTRAMUSCULAR | Status: DC

## 2019-03-14 MED ORDER — MORPHINE SULFATE (PF) 4 MG/ML IV SOLN: 6.0000 mg | Freq: Once | INTRAVENOUS | Status: DC

## 2019-03-14 NOTE — ED Triage Notes (Signed)
Patient reports throbbing pain (rated 4 on a 0-10) on her left side lower back, reports purplish color on bilateral legs, and also reports feeling "puffy" in her face, legs, and hands. Patient also reports muscle cramps and spasm.

## 2019-03-14 NOTE — ED Notes (Signed)
Patient verbalizes understanding of discharge instructions. Opportunity for questioning and answers were provided. Armband removed by staff, pt discharged from ED.  

## 2019-03-14 NOTE — ED Provider Notes (Addendum)
[facility] DEPARTMENT Provider Note   CSN: [id number] Arrival date & time: 03/14/19  9379    History   Chief Complaint No chief complaint on file.   HPI Sydney [name] is a 28 y.o. female.     HPI   Patient presents for evaluation of "a slew of problems."  She reports since being discharged and treated for pyelonephritis, she has persistent left flank pain, general achiness, skin burning, discoloration of her lower legs, dyspnea on exertion, general weakness and disorientation.  She drove her vehicle here for evaluation.  Currently, she lives with her 2 infant children and parents.  There are no other known modifying factors.  Past Medical History:  Diagnosis Date  . Asthma    prn inhaler-mostly in spring  . Burn of left elbow 10/18/2013  . Cut 10/18/2013   right lower leg - no sutures  . History of MRSA infection age 36   thigh  . Iron deficiency anemia 09/03/2015  . Migraine headache   . PONV (postoperative nausea and vomiting)   . Postpartum care following cesarean delivery (9/7) 09/03/2015  . Salivary gland stone 09/2013   left  . Seasonal allergies    nasal congestion and sore throat 10/18/2013  . TMJ tenderness    with opening mouth wide    Patient Active Problem List   Diagnosis Date Noted  . Mild intermittent asthma without complication   . Sepsis secondary to UTI ([place]) 03/07/2019  . Depression with anxiety 03/07/2019  . Frequent urination 12/01/2018  . Complicated UTI (urinary tract infection) 12/01/2018  . Supervision of normal pregnancy, antepartum 11/02/2016  . Hx of preeclampsia, prior pregnancy, currently pregnant 11/02/2016  . Previous cesarean section 11/02/2016    Past Surgical History:  Procedure Laterality Date  . CESAREAN SECTION N/A 09/03/2015   Procedure: CESAREAN SECTION;  Surgeon: Brien Few, MD;  Location: [facility];  Service: Obstetrics;  Laterality: N/A;  . DERMOID CYST  EXCISION Left 04/25/12   abd.  wall  . LIPOMA EXCISION  07/2013   back  . SUBLINGUAL SALIVARY CYST EXCISION Left 10/24/2013   Procedure: REMOVAL OF SALIVARY GLAND STONE LEFT SIDE;  Surgeon: Ceasar Mons, DDS;  Location: [facility];  Service: Oral Surgery;  Laterality: Left;  . TONSILLECTOMY AND ADENOIDECTOMY    . WISDOM TOOTH EXTRACTION  10/2011     OB History    Gravida  2   Para  1   Term  1   Preterm      AB      Living  1     SAB      TAB      Ectopic      Multiple  0   Live Births  1            Home Medications    Prior to Admission medications   Medication Sig Start Date End Date Taking? Authorizing Provider  albuterol (PROVENTIL HFA;VENTOLIN HFA) 108 (90 Base) MCG/ACT inhaler Inhale 1 puff into the lungs every 6 (six) hours as needed for wheezing or shortness of breath.   Yes [provider]  cephALEXin (KEFLEX) 500 MG capsule Take 1 capsule (500 mg total) by mouth 4 (four) times daily for 5 days. 03/10/19 03/15/19 Yes Charlynne Cousins, MD  ENSKYCE 0.15-30 MG-MCG tablet Take 1 tablet by mouth daily. 11/06/18  Yes [provider]  hydrOXYzine (ATARAX/VISTARIL) 25 MG tablet Take 1 tablet (25 mg total) by mouth  2 (two) times daily as needed for anxiety. 03/06/19  Yes Perlie Mayo, NP  ketorolac (TORADOL) 10 MG tablet Take 1 tablet (10 mg total) by mouth every 6 (six) hours as needed for moderate pain or severe pain. 03/06/19  Yes Perlie Mayo, NP  sertraline (ZOLOFT) 100 MG tablet Take 100 mg by mouth daily.   Yes [provider]  FLUoxetine (PROZAC) 20 MG tablet Take 1 tablet (20 mg total) by mouth daily. Patient not taking: Reported on 03/07/2019 03/06/19   Perlie Mayo, NP    Family History Family History  Problem Relation Age of Onset  . Depression Mother   . Anxiety disorder Mother   . Vision loss Father   . Vision loss Paternal Aunt   . Cancer Maternal Grandmother   . Depression Maternal Grandmother   . Diabetes Maternal  Grandmother   . Hyperlipidemia Maternal Grandmother   . Hypertension Maternal Grandmother   . Miscarriages / Stillbirths Maternal Grandmother   . Cancer Maternal Grandfather   . Alcohol abuse Paternal Grandmother   . Depression Paternal Grandmother   . Alcohol abuse Paternal Grandfather   . Diabetes Paternal Grandfather   . Drug abuse Brother        Marijuana   . Anxiety disorder Brother   . Mental illness Brother        extensive mental history  . COPD Neg Hx   . Arthritis Neg Hx   . Asthma Neg Hx   . Early death Neg Hx   . Hearing loss Neg Hx   . Heart disease Neg Hx   . Kidney disease Neg Hx   . Learning disabilities Neg Hx   . Mental retardation Neg Hx   . Stroke Neg Hx   . Varicose Veins Neg Hx     Social History Social History   Tobacco Use  . Smoking status: Never Smoker  . Smokeless tobacco: Never Used  Substance Use Topics  . Alcohol use: No  . Drug use: No     Allergies   Apple; Cherry; Fruit extracts; and Black walnut pollen allergy skin test   Review of Systems Review of Systems  All other systems reviewed and are negative.    Physical Exam Updated Vital Signs BP 116/76   Pulse 90   Temp (!) 97.4 F (36.3 C) (Oral)   Resp 16   LMP 03/12/2019 (Exact Date)   SpO2 100%   Physical Exam Vitals signs and nursing note reviewed.  Constitutional:      General: She is not in acute distress.    Appearance: Normal appearance. She is well-developed. She is not ill-appearing, toxic-appearing or diaphoretic.  HENT:     Head: Normocephalic and atraumatic.  Eyes:     Conjunctiva/sclera: Conjunctivae normal.     Pupils: Pupils are equal, round, and reactive to light.  Neck:     Musculoskeletal: Normal range of motion and neck supple.     Trachea: Phonation normal.  Cardiovascular:     Rate and Rhythm: Normal rate and regular rhythm.  Pulmonary:     Effort: Pulmonary effort is normal.     Breath sounds: Normal breath sounds.  Chest:     Chest  wall: No tenderness.  Abdominal:     General: There is no distension.     Palpations: Abdomen is soft.     Tenderness: There is no abdominal tenderness. There is no guarding.  Genitourinary:    Comments: No costovertebral angle tenderness Musculoskeletal:  Normal range of motion.        General: No swelling, tenderness or signs of injury.  Skin:    General: Skin is warm and dry.     Coloration: Skin is not jaundiced or pale.     Findings: No bruising, erythema, lesion or rash.  Neurological:     Mental Status: She is alert and oriented to person, place, and time. Mental status is at baseline.     Cranial Nerves: No cranial nerve deficit.     Motor: No abnormal muscle tone.     Coordination: Coordination normal.  Psychiatric:        Behavior: Behavior normal.        Thought Content: Thought content normal.        Judgment: Judgment normal.     Comments: Anxious      ED Treatments / Results  Labs (all labs ordered are listed, but only abnormal results are displayed) Labs Reviewed  CBC WITH DIFFERENTIAL/PLATELET - Abnormal; Notable for the following components:      Result Value   WBC 3.5 (*)    Hemoglobin 11.9 (*)    Neutro Abs 1.4 (*)    All other components within normal limits  URINE CULTURE  LACTIC ACID, PLASMA  BASIC METABOLIC PANEL  URINALYSIS, ROUTINE W REFLEX MICROSCOPIC  LACTIC ACID, PLASMA    EKG None  Radiology No results found.  Procedures Procedures (including critical care time)  Medications Ordered in ED Medications  sodium chloride 0.9 % bolus 500 mL (500 mLs Intravenous New Bag/Given 03/14/19 1020)     Initial Impression / Assessment and Plan / ED Course  I have reviewed the triage vital signs and the nursing notes.  Pertinent labs & imaging results that were available during my care of the patient were reviewed by me and considered in my medical decision making (see chart for details).  Clinical Course as of Mar 13 1248  Wed Mar 14, 2019   1243 Normal  Lactic acid, plasma [EW]  1243 Normal  Basic metabolic panel [EW]  8938 Normal  Urinalysis, Routine w reflex microscopic [EW]  1244 Normal except white count low, hemoglobin low  CBC with Differential(!) [EW]    Clinical Course User Index [EW] Daleen Bo, MD        Patient Vitals for the past 24 hrs:  BP Temp Temp src Pulse Resp SpO2  03/14/19 1237 116/76 - - 90 16 100 %  03/14/19 1045 113/84 - - 68 - 100 %  03/14/19 1030 120/73 - - 65 - 100 %  03/14/19 1015 134/87 - - 87 - 100 %  03/14/19 1009 - - - 81 - 100 %  03/14/19 1008 - - - 92 - 100 %  03/14/19 1007 - - - 81 - 96 %  03/14/19 1006 - - - 79 - 100 %  03/14/19 0951 124/78 (!) 97.4 F (36.3 C) Oral 77 17 100 %   3:25 PM Reevaluation with update and discussion. After initial assessment and treatment, an updated evaluation reveals she is comfortable has no further complaints.  Findings discussed with the patient and all questions were answered. Daleen Bo   Medical Decision Making: Specific flank pain with recent UTI.  Screening evaluation is very reassuring.  Continue pyelonephritis, bacterial infection, metabolic instability or impending vascular collapse.  CRITICAL CARE-no Performed by: Daleen Bo  Nursing Notes Reviewed/ Care Coordinated Applicable Imaging Reviewed Interpretation of Laboratory Data incorporated into ED treatment  The patient appears reasonably  screened and/or stabilized for discharge and I doubt any other medical condition or other [facility] requiring further screening, evaluation, or treatment in the ED at this time prior to discharge.  Plan: Home Medications-continue current medications and use OTC analgesia of choice; Home Treatments-rest, fluids; return here if the recommended treatment, does not improve the symptoms; Recommended follow up-PCP, PRN.   Final Clinical Impressions(s) / ED Diagnoses   Final diagnoses:  Left flank pain    ED Discharge Orders    None        Daleen Bo, MD 03/14/19 1249    Daleen Bo, MD 03/14/19 1527

## 2019-03-14 NOTE — Discharge Instructions (Signed)
The testing today is reassuring.  There is no sign of a continued kidney infection or problems.  It is safe to continue taking her medicines and use an over-the-counter pain reliever such as Tylenol if needed.  Sure you are getting plenty of rest, eating regularly and getting some mild exercise.

## 2019-03-14 NOTE — ED Notes (Signed)
Called EDMD to see if he wanted second lactic EDMD said no.

## 2019-03-15 LAB — URINE CULTURE: Culture: NO GROWTH

## 2019-03-19 ENCOUNTER — Telehealth: Payer: Self-pay | Admitting: *Deleted

## 2019-03-19 NOTE — Telephone Encounter (Signed)
Called patient and left detailed message regarding restrictions.

## 2019-03-20 ENCOUNTER — Encounter: Payer: Self-pay | Admitting: Women's Health

## 2019-03-20 ENCOUNTER — Ambulatory Visit (INDEPENDENT_AMBULATORY_CARE_PROVIDER_SITE_OTHER): Payer: Medicaid Other | Admitting: Women's Health

## 2019-03-20 ENCOUNTER — Other Ambulatory Visit: Payer: Self-pay

## 2019-03-20 VITALS — BP 124/82 | HR 88 | Temp 98.8°F | Ht 67.0 in | Wt 197.0 lb

## 2019-03-20 DIAGNOSIS — Z3202 Encounter for pregnancy test, result negative: Secondary | ICD-10-CM

## 2019-03-20 DIAGNOSIS — Z30430 Encounter for insertion of intrauterine contraceptive device: Secondary | ICD-10-CM

## 2019-03-20 LAB — POCT URINE PREGNANCY: Preg Test, Ur: NEGATIVE

## 2019-03-20 MED ORDER — LEVONORGESTREL 19.5 MCG/DAY IU IUD
INTRAUTERINE_SYSTEM | Freq: Once | INTRAUTERINE | Status: AC
  Administered 2019-03-20: 13:00:00 via INTRAUTERINE

## 2019-03-20 NOTE — Addendum Note (Signed)
Addended by: Linton Rump on: 03/20/2019 01:20 PM   Modules accepted: Orders

## 2019-03-20 NOTE — Progress Notes (Signed)
   IUD INSERTION Patient name: Sydney [name] MRN [id number]  Date of birth: 1991-02-10 Subjective Findings:   Sydney [name] is a 28 y.o. G39P2002 Caucasian female being seen today for insertion of a Liletta IUD.   Patient's last menstrual period was 03/12/2019. Last sexual intercourse was >2wks ago Last papOct 2018 in [facility]. Results were:  normal  The risks and benefits of the method and placement have been thouroughly reviewed with the patient and all questions were answered.  Specifically the patient is aware of failure rate of 12/998, expulsion of the IUD and of possible perforation.  The patient is aware of irregular bleeding due to the method and understands the incidence of irregular bleeding diminishes with time.  Signed copy of informed consent in chart.  Pertinent History Reviewed:   Reviewed past medical,surgical, social, obstetrical and family history.  Reviewed problem list, medications and allergies. Objective Findings & Procedure:   Vitals:   03/20/19 1147  BP: 124/82  Pulse: 88  Temp: 98.8 F (37.1 C)  Weight: 197 lb (89.4 kg)  Height: 5\' 7"  (1.702 m)  Body mass index is 30.85 kg/m.  Results for orders placed or performed in visit on 03/20/19 (from the past 24 hour(s))  POCT urine pregnancy   Collection Time: 03/20/19 11:52 AM  Result Value Ref Range   Preg Test, Ur Negative Negative     Time out was performed.  A graves speculum was placed in the vagina.  The cervix was visualized, prepped using Betadine, and grasped with a single tooth tenaculum. The uterus was found to be neutral and it sounded to 8 cm.  Liletta IUD placed per manufacturer's recommendations. The strings were trimmed to approximately 3 cm. The patient tolerated the procedure well.   Informal transvaginal sonogram was performed and the proper placement of the IUD was verified. Assessment & Plan:   1) Liletta IUD insertion The patient was given post procedure instructions, including signs  and symptoms of infection and to check for the strings after each menses or each month, and refraining from intercourse or anything in the vagina for 3 days. She was given a Liletta care card with date IUD placed, and date IUD to be removed. She is scheduled for a f/u appointment in 4 weeks.  Orders Placed This Encounter  Procedures  . POCT urine pregnancy    Return in about 4 weeks (around 04/17/2019) for tele visit. d/t coronavirus, make in person visit if having problems  [place], [facility] 03/20/2019 1:02 PM

## 2019-03-20 NOTE — Patient Instructions (Addendum)
 Nothing in vagina for 3 days (no sex, douching, tampons, etc...)  Check your strings once a month to make sure you can feel them, if you are not able to please let us know  If you develop a fever of 100.4 or more in the next few weeks, or if you develop severe abdominal pain, please let us know  Use a backup method of birth control, such as condoms, for 2 weeks     Intrauterine Device Insertion, Care After  This sheet gives you information about how to care for yourself after your procedure. Your health care provider may also give you more specific instructions. If you have problems or questions, contact your health care provider. What can I expect after the procedure? After the procedure, it is common to have:  Cramps and pain in the abdomen.  Light bleeding (spotting) or heavier bleeding that is like your menstrual period. This may last for up to a few days.  Lower back pain.  Dizziness.  Headaches.  Nausea. Follow these instructions at home:  Before resuming sexual activity, check to make sure that you can feel the IUD string(s). You should be able to feel the end of the string(s) below the opening of your cervix. If your IUD string is in place, you may resume sexual activity. ? If you had a hormonal IUD inserted more than 7 days after your most recent period started, you will need to use a backup method of birth control for 7 days after IUD insertion. Ask your health care provider whether this applies to you.  Continue to check that the IUD is still in place by feeling for the string(s) after every menstrual period, or once a month.  Take over-the-counter and prescription medicines only as told by your health care provider.  Do not drive or use heavy machinery while taking prescription pain medicine.  Keep all follow-up visits as told by your health care provider. This is important. Contact a health care provider if:  You have bleeding that is heavier or lasts longer  than a normal menstrual cycle.  You have a fever.  You have cramps or abdominal pain that get worse or do not get better with medicine.  You develop abdominal pain that is new or is not in the same area of earlier cramping and pain.  You feel lightheaded or weak.  You have abnormal or bad-smelling discharge from your vagina.  You have pain during sexual activity.  You have any of the following problems with your IUD string(s): ? The string bothers or hurts you or your sexual partner. ? You cannot feel the string. ? The string has gotten longer.  You can feel the IUD in your vagina.  You think you may be pregnant, or you miss your menstrual period.  You think you may have an STI (sexually transmitted infection). Get help right away if:  You have flu-like symptoms.  You have a fever and chills.  You can feel that your IUD has slipped out of place. Summary  After the procedure, it is common to have cramps and pain in the abdomen. It is also common to have light bleeding (spotting) or heavier bleeding that is like your menstrual period.  Continue to check that the IUD is still in place by feeling for the string(s) after every menstrual period, or once a month.  Keep all follow-up visits as told by your health care provider. This is important.  Contact your health care provider   you have problems with your IUD string(s), such as the string getting longer or bothering you or your sexual partner. This information is not intended to replace advice given to you by your health care provider. Make sure you discuss any questions you have with your health care provider. Document Released: 08/11/2011 Document Revised: 11/03/2016 Document Reviewed: 11/03/2016 Elsevier Interactive Patient Education  2019 Elsevier Inc.  Levonorgestrel intrauterine device (IUD) What is this medicine? LEVONORGESTREL IUD (LEE voe nor jes trel) is a contraceptive (birth control) device. The device is placed  inside the uterus by a healthcare professional. It is used to prevent pregnancy. This device can also be used to treat heavy bleeding that occurs during your period. This medicine may be used for other purposes; ask your health care provider or pharmacist if you have questions. COMMON BRAND NAME(S): Kyleena, LILETTA, Mirena, Skyla What should I tell my health care provider before I take this medicine? They need to know if you have any of these conditions: -abnormal Pap smear -cancer of the breast, uterus, or cervix -diabetes -endometritis -genital or pelvic infection now or in the past -have more than one sexual partner or your partner has more than one partner -heart disease -history of an ectopic or tubal pregnancy -immune system problems -IUD in place -liver disease or tumor -problems with blood clots or take blood-thinners -seizures -use intravenous drugs -uterus of unusual shape -vaginal bleeding that has not been explained -an unusual or allergic reaction to levonorgestrel, other hormones, silicone, or polyethylene, medicines, foods, dyes, or preservatives -pregnant or trying to get pregnant -breast-feeding How should I use this medicine? This device is placed inside the uterus by a health care professional. Talk to your pediatrician regarding the use of this medicine in children. Special care may be needed. Overdosage: If you think you have taken too much of this medicine contact a poison control center or emergency room at once. NOTE: This medicine is only for you. Do not share this medicine with others. What if I miss a dose? This does not apply. Depending on the brand of device you have inserted, the device will need to be replaced every 3 to 5 years if you wish to continue using this type of birth control. What may interact with this medicine? Do not take this medicine with any of the following medications: -amprenavir -bosentan -fosamprenavir This medicine may also  interact with the following medications: -aprepitant -armodafinil -barbiturate medicines for inducing sleep or treating seizures -bexarotene -boceprevir -griseofulvin -medicines to treat seizures like carbamazepine, ethotoin, felbamate, oxcarbazepine, phenytoin, topiramate -modafinil -pioglitazone -rifabutin -rifampin -rifapentine -some medicines to treat HIV infection like atazanavir, efavirenz, indinavir, lopinavir, nelfinavir, tipranavir, ritonavir -St. John's wort -warfarin This list may not describe all possible interactions. Give your health care provider a list of all the medicines, herbs, non-prescription drugs, or dietary supplements you use. Also tell them if you smoke, drink alcohol, or use illegal drugs. Some items may interact with your medicine. What should I watch for while using this medicine? Visit your doctor or health care professional for regular check ups. See your doctor if you or your partner has sexual contact with others, becomes HIV positive, or gets a sexual transmitted disease. This product does not protect you against HIV infection (AIDS) or other sexually transmitted diseases. You can check the placement of the IUD yourself by reaching up to the top of your vagina with clean fingers to feel the threads. Do not pull on the threads. It is a good habit   to check placement after each menstrual period. Call your doctor right away if you feel more of the IUD than just the threads or if you cannot feel the threads at all. The IUD may come out by itself. You may become pregnant if the device comes out. If you notice that the IUD has come out use a backup birth control method like condoms and call your health care provider. Using tampons will not change the position of the IUD and are okay to use during your period. This IUD can be safely scanned with magnetic resonance imaging (MRI) only under specific conditions. Before you have an MRI, tell your healthcare provider that  you have an IUD in place, and which type of IUD you have in place. What side effects may I notice from receiving this medicine? Side effects that you should report to your doctor or health care professional as soon as possible: -allergic reactions like skin rash, itching or hives, swelling of the face, lips, or tongue -fever, flu-like symptoms -genital sores -high blood pressure -no menstrual period for 6 weeks during use -pain, swelling, warmth in the leg -pelvic pain or tenderness -severe or sudden headache -signs of pregnancy -stomach cramping -sudden shortness of breath -trouble with balance, talking, or walking -unusual vaginal bleeding, discharge -yellowing of the eyes or skin Side effects that usually do not require medical attention (report to your doctor or health care professional if they continue or are bothersome): -acne -breast pain -change in sex drive or performance -changes in weight -cramping, dizziness, or faintness while the device is being inserted -headache -irregular menstrual bleeding within first 3 to 6 months of use -nausea This list may not describe all possible side effects. Call your doctor for medical advice about side effects. You may report side effects to FDA at 1-800-FDA-1088. Where should I keep my medicine? This does not apply. NOTE: This sheet is a summary. It may not cover all possible information. If you have questions about this medicine, talk to your doctor, pharmacist, or health care provider.  2019 Elsevier/Gold Standard (2016-09-24 14:14:56)  

## 2019-03-26 ENCOUNTER — Telehealth: Payer: Self-pay | Admitting: *Deleted

## 2019-03-26 NOTE — Telephone Encounter (Signed)
Pt was an inpatient at [facility] about 3 weeks ago. She was being treated for a kidney infection and sepsis but her cultures came back positive for ecoli. She was released on 03-10-19 but since then she still has diarrhea, stomach pains, no fever. Wanted to know what could be called in to help this or if she needed to retest for the ecoli. As her stomach pains are no better.

## 2019-03-26 NOTE — Telephone Encounter (Signed)
Can you schedule her for [facility] tomorrow-

## 2019-03-26 NOTE — Telephone Encounter (Signed)
LVM requesting pt call back to set up appt

## 2019-03-26 NOTE — Telephone Encounter (Signed)
Please advise recommendation

## 2019-04-03 NOTE — Telephone Encounter (Signed)
LVM requesting call back.

## 2019-04-06 ENCOUNTER — Other Ambulatory Visit: Payer: Self-pay

## 2019-04-06 ENCOUNTER — Emergency Department (HOSPITAL_COMMUNITY): Payer: Medicaid Other

## 2019-04-06 ENCOUNTER — Emergency Department (HOSPITAL_COMMUNITY)
Admission: EM | Admit: 2019-04-06 | Discharge: 2019-04-07 | Disposition: A | Discharge status: Home / Self Care | Payer: Medicaid Other | Attending: Emergency Medicine | Admitting: Emergency Medicine

## 2019-04-06 DIAGNOSIS — R3915 Urgency of urination: Secondary | ICD-10-CM | POA: Insufficient documentation

## 2019-04-06 DIAGNOSIS — R509 Fever, unspecified: Secondary | ICD-10-CM | POA: Diagnosis present

## 2019-04-06 DIAGNOSIS — R300 Dysuria: Secondary | ICD-10-CM | POA: Diagnosis not present

## 2019-04-06 DIAGNOSIS — Z8709 Personal history of other diseases of the respiratory system: Secondary | ICD-10-CM | POA: Diagnosis not present

## 2019-04-06 DIAGNOSIS — Z87440 Personal history of urinary (tract) infections: Secondary | ICD-10-CM | POA: Insufficient documentation

## 2019-04-06 DIAGNOSIS — R1032 Left lower quadrant pain: Secondary | ICD-10-CM | POA: Diagnosis not present

## 2019-04-06 DIAGNOSIS — R350 Frequency of micturition: Secondary | ICD-10-CM | POA: Insufficient documentation

## 2019-04-06 LAB — I-STAT BETA HCG BLOOD, ED (MC, WL, AP ONLY): I-stat hCG, quantitative: <5 m[IU]/mL (ref <5)

## 2019-04-06 LAB — COMPREHENSIVE METABOLIC PANEL
ALT: 24 U/L (ref 0–44)
AST: 25 U/L (ref 15–41)
Albumin: 3.8 g/dL (ref 3.5–5.0)
Alkaline Phosphatase: 76 U/L (ref 38–126)
Anion gap: 10 (ref 5–15)
BUN: 7 mg/dL (ref 6–20)
CO2: 20 mmol/L — ABNORMAL LOW (ref 22–32)
Calcium: 8.6 mg/dL — ABNORMAL LOW (ref 8.9–10.3)
Chloride: 103 mmol/L (ref 98–111)
Creatinine, Ser: 0.85 mg/dL (ref 0.44–1.00)
GFR calc Af Amer: >60 mL/min (ref >60)
GFR calc non Af Amer: >60 mL/min (ref >60)
Glucose, Bld: 110 mg/dL — ABNORMAL HIGH (ref 70–99)
Potassium: 3.5 mmol/L (ref 3.5–5.1)
Sodium: 133 mmol/L — ABNORMAL LOW (ref 135–145)
Total Bilirubin: 0.6 mg/dL (ref 0.3–1.2)
Total Protein: 7.7 g/dL (ref 6.5–8.1)

## 2019-04-06 LAB — URINALYSIS, COMPLETE (UACMP) WITH MICROSCOPIC
Bacteria, UA: NONE SEEN
Bilirubin Urine: NEGATIVE
Glucose, UA: NEGATIVE mg/dL
Hgb urine dipstick: NEGATIVE
Ketones, ur: NEGATIVE mg/dL
Leukocytes,Ua: NEGATIVE
Nitrite: NEGATIVE
Protein, ur: NEGATIVE mg/dL
Specific Gravity, Urine: 1.027 (ref 1.005–1.030)
pH: 6 (ref 5.0–8.0)

## 2019-04-06 LAB — CBC WITH DIFFERENTIAL/PLATELET
Abs Immature Granulocytes: 0.04 10*3/uL (ref 0.00–0.07)
Basophils Absolute: 0.1 10*3/uL (ref 0.0–0.1)
Basophils Relative: 1 %
Eosinophils Absolute: 0.2 10*3/uL (ref 0.0–0.5)
Eosinophils Relative: 3 %
HCT: 39.7 % (ref 36.0–46.0)
Hemoglobin: 12.3 g/dL (ref 12.0–15.0)
Immature Granulocytes: 1 %
Lymphocytes Relative: 15 %
Lymphs Abs: 0.8 10*3/uL (ref 0.7–4.0)
MCH: 27.2 pg (ref 26.0–34.0)
MCHC: 31 g/dL (ref 30.0–36.0)
MCV: 87.6 fL (ref 80.0–100.0)
Monocytes Absolute: 0.4 10*3/uL (ref 0.1–1.0)
Monocytes Relative: 7 %
Neutro Abs: 3.9 10*3/uL (ref 1.7–7.7)
Neutrophils Relative %: 73 %
Platelets: 176 10*3/uL (ref 150–400)
RBC: 4.53 MIL/uL (ref 3.87–5.11)
RDW: 14.3 % (ref 11.5–15.5)
WBC: 5.4 10*3/uL (ref 4.0–10.5)
nRBC: 0 % (ref 0.0–0.2)

## 2019-04-06 LAB — URINALYSIS, ROUTINE W REFLEX MICROSCOPIC
Bilirubin Urine: NEGATIVE
Glucose, UA: NEGATIVE mg/dL
Hgb urine dipstick: NEGATIVE
Ketones, ur: NEGATIVE mg/dL
Leukocytes,Ua: NEGATIVE
Nitrite: NEGATIVE
Protein, ur: NEGATIVE mg/dL
Specific Gravity, Urine: 1.027 (ref 1.005–1.030)
pH: 6 (ref 5.0–8.0)

## 2019-04-06 LAB — WET PREP, GENITAL
Clue Cells Wet Prep HPF POC: NONE SEEN
Sperm: NONE SEEN
Trich, Wet Prep: NONE SEEN
Yeast Wet Prep HPF POC: NONE SEEN

## 2019-04-06 LAB — LACTIC ACID, PLASMA: Lactic Acid, Venous: 2.1 mmol/L (ref 0.5–1.9)

## 2019-04-06 MED ORDER — SODIUM CHLORIDE 0.9% FLUSH: 3.0000 mL | Freq: Once | INTRAVENOUS | Status: DC

## 2019-04-06 MED ORDER — ACETAMINOPHEN 325 MG PO TABS
650.0000 mg | ORAL_TABLET | Freq: Once | ORAL | Status: AC
  Administered 2019-04-06: 650 mg via ORAL
  Filled 2019-04-06: qty 2

## 2019-04-06 MED ORDER — SODIUM CHLORIDE 0.9 % IV BOLUS
1000.0000 mL | Freq: Once | INTRAVENOUS | Status: AC
  Administered 2019-04-06: 1000 mL via INTRAVENOUS

## 2019-04-06 MED ORDER — SODIUM CHLORIDE 0.9 % IV BOLUS
500.0000 mL | Freq: Once | INTRAVENOUS | Status: AC
  Administered 2019-04-06: 500 mL via INTRAVENOUS

## 2019-04-06 NOTE — ED Provider Notes (Signed)
[facility]ERG[facility] DEPARTMENT Provider Note   CSN: [id number] Arrival date & time: 04/06/19  1716    History   Chief Complaint Chief Complaint  Patient presents with  . Fever  . Dysuria    HPI Sydney [name] is a 28 y.o. female with past medical history of UTI/pyelonephritis causing sepsis, asthma, who presents today for evaluation of fever, left sided flank/side pain.  She reports that she has had urinary urgency and frequency along with left-sided lateral lower abdominal pain.  She says that this started today.  She developed a fever over 102 at home.  She took 400 mg of ibuprofen at about 1500 today.  She reports that she and her family have been quarantining themselves at home for the past 6 weeks, she does not have any known coronavirus contacts.  She denies cough, shortness of breath, chest pain or tightness.  No nausea vomiting or diarrhea.  She denies history of kidney stones.       HPI  Past Medical History:  Diagnosis Date  . Asthma    prn inhaler-mostly in spring  . Burn of left elbow 10/18/2013  . Cut 10/18/2013   right lower leg - no sutures  . History of MRSA infection age 47   thigh  . Iron deficiency anemia 09/03/2015  . Migraine headache   . PONV (postoperative nausea and vomiting)   . Postpartum care following cesarean delivery (9/7) 09/03/2015  . Salivary gland stone 09/2013   left  . Seasonal allergies    nasal congestion and sore throat 10/18/2013  . TMJ tenderness    with opening mouth wide    Patient Active Problem List   Diagnosis Date Noted  . Encounter for IUD insertion 03/20/2019  . Mild intermittent asthma without complication   . Sepsis secondary to UTI ([place]) 03/07/2019  . Depression with anxiety 03/07/2019  . Frequent urination 12/01/2018  . Complicated UTI (urinary tract infection) 12/01/2018  . Supervision of normal pregnancy, antepartum 11/02/2016  . Hx of preeclampsia, prior pregnancy, currently pregnant  11/02/2016  . Previous cesarean section 11/02/2016    Past Surgical History:  Procedure Laterality Date  . CESAREAN SECTION N/A 09/03/2015   Procedure: CESAREAN SECTION;  Surgeon: Brien Few, MD;  Location: [facility];  Service: Obstetrics;  Laterality: N/A;  . DERMOID CYST  EXCISION Left 04/25/12   abd. wall  . LIPOMA EXCISION  07/2013   back  . SUBLINGUAL SALIVARY CYST EXCISION Left 10/24/2013   Procedure: REMOVAL OF SALIVARY GLAND STONE LEFT SIDE;  Surgeon: Ceasar Mons, DDS;  Location: [facility];  Service: Oral Surgery;  Laterality: Left;  . TONSILLECTOMY AND ADENOIDECTOMY    . WISDOM TOOTH EXTRACTION  10/2011     OB History    Gravida  2   Para  2   Term  2   Preterm      AB      Living  2     SAB      TAB      Ectopic      Multiple  0   Live Births  2            Home Medications    Prior to Admission medications   Medication Sig Start Date End Date Taking? Authorizing Provider  fexofenadine-pseudoephedrine (ALLEGRA-D 24) 180-240 MG 24 hr tablet Take 1 tablet by mouth daily.   Yes [provider]  FLUoxetine (PROZAC) 20 MG tablet Take 1 tablet (  20 mg total) by mouth daily. 03/06/19  Yes Perlie Mayo, NP  fluticasone (FLONASE) 50 MCG/ACT nasal spray Place 2 sprays into both nostrils daily.   Yes [provider]  hydrOXYzine (ATARAX/VISTARIL) 25 MG tablet Take 1 tablet (25 mg total) by mouth 2 (two) times daily as needed for anxiety. Patient not taking: Reported on 04/06/2019 03/06/19   Perlie Mayo, NP  ketorolac (TORADOL) 10 MG tablet Take 1 tablet (10 mg total) by mouth every 6 (six) hours as needed for moderate pain or severe pain. Patient not taking: Reported on 04/06/2019 03/06/19   Perlie Mayo, NP    Family History Family History  Problem Relation Age of Onset  . Depression Mother   . Anxiety disorder Mother   . Vision loss Father   . Vision loss Paternal Aunt   . Cancer Maternal Grandmother   .  Depression Maternal Grandmother   . Diabetes Maternal Grandmother   . Hyperlipidemia Maternal Grandmother   . Hypertension Maternal Grandmother   . Miscarriages / Stillbirths Maternal Grandmother   . Cancer Maternal Grandfather   . Alcohol abuse Paternal Grandmother   . Depression Paternal Grandmother   . Alcohol abuse Paternal Grandfather   . Diabetes Paternal Grandfather   . Drug abuse Brother        Marijuana   . Anxiety disorder Brother   . Mental illness Brother        extensive mental history  . COPD Neg Hx   . Arthritis Neg Hx   . Asthma Neg Hx   . Early death Neg Hx   . Hearing loss Neg Hx   . Heart disease Neg Hx   . Kidney disease Neg Hx   . Learning disabilities Neg Hx   . Mental retardation Neg Hx   . Stroke Neg Hx   . Varicose Veins Neg Hx     Social History Social History   Tobacco Use  . Smoking status: Never Smoker  . Smokeless tobacco: Never Used  Substance Use Topics  . Alcohol use: No  . Drug use: No     Allergies   Apple; Cherry; Fruit extracts; and Black walnut pollen allergy skin test   Review of Systems Review of Systems  Constitutional: Positive for chills and fever.  Respiratory: Negative for cough, chest tightness and shortness of breath.   Cardiovascular: Negative for chest pain, palpitations and leg swelling.  Gastrointestinal: Negative for abdominal pain, constipation, diarrhea, nausea and vomiting.  Genitourinary: Positive for dysuria, frequency and urgency. Negative for decreased urine volume, flank pain, hematuria, menstrual problem, vaginal bleeding, vaginal discharge and vaginal pain.  Musculoskeletal: Negative for back pain and neck pain.  Neurological: Negative for dizziness, weakness, numbness and headaches.  All other systems reviewed and are negative.    Physical Exam Updated Vital Signs BP 118/81   Pulse (!) 111   Temp 99.4 F (37.4 C) (Oral)   Resp 16   LMP 03/12/2019   SpO2 98%   Physical Exam Vitals signs  and nursing note reviewed. Exam conducted with a chaperone present (Female Tech).  Constitutional:      General: She is not in acute distress.    Appearance: She is well-developed.  HENT:     Head: Normocephalic and atraumatic.     Mouth/Throat:     Mouth: Mucous membranes are moist.  Eyes:     Conjunctiva/sclera: Conjunctivae normal.  Neck:     Musculoskeletal: Normal range of motion and neck supple.  Cardiovascular:     Rate and Rhythm: Normal rate and regular rhythm.     Heart sounds: No murmur.  Pulmonary:     Effort: Pulmonary effort is normal. No respiratory distress.     Breath sounds: Normal breath sounds.  Abdominal:     General: Abdomen is flat. There is no distension.     Palpations: Abdomen is soft. There is no mass.     Tenderness: There is abdominal tenderness (LLQ (mild)). There is no right CVA tenderness, left CVA tenderness or rebound.     Hernia: No hernia is present.  Genitourinary:    Vagina: Normal. No vaginal discharge, tenderness, bleeding or lesions.     Cervix: No cervical motion tenderness, discharge or erythema.     Adnexa: Right adnexa normal.       Right: No tenderness or fullness.         Left: Tenderness present. No fullness.    Musculoskeletal: Normal range of motion.  Skin:    General: Skin is warm and dry.  Neurological:     General: No focal deficit present.     Mental Status: She is alert and oriented to person, place, and time.  Psychiatric:        Mood and Affect: Mood normal.        Behavior: Behavior normal.      ED Treatments / Results  Labs (all labs ordered are listed, but only abnormal results are displayed) Labs Reviewed  WET PREP, GENITAL - Abnormal; Notable for the following components:      Result Value   WBC, Wet Prep HPF POC FEW (*)    All other components within normal limits  LACTIC ACID, PLASMA - Abnormal; Notable for the following components:   Lactic Acid, Venous 2.1 (*)    All other components within normal  limits  COMPREHENSIVE METABOLIC PANEL - Abnormal; Notable for the following components:   Sodium 133 (*)    CO2 20 (*)    Glucose, Bld 110 (*)    Calcium 8.6 (*)    All other components within normal limits  URINALYSIS, ROUTINE W REFLEX MICROSCOPIC - Abnormal; Notable for the following components:   APPearance HAZY (*)    All other components within normal limits  URINALYSIS, COMPLETE (UACMP) WITH MICROSCOPIC - Abnormal; Notable for the following components:   APPearance HAZY (*)    All other components within normal limits  URINE CULTURE  CBC WITH DIFFERENTIAL/PLATELET  LACTIC ACID, PLASMA  I-STAT BETA HCG BLOOD, ED ([facility], [facility], [facility] ONLY)  GC/CHLAMYDIA PROBE AMP ([place]) NOT AT [facility]    EKG None  Radiology No results found.  Procedures Procedures (including critical care time)  Medications Ordered in ED Medications  sodium chloride flush (NS) 0.9 % injection 3 mL (has no administration in time range)  sodium chloride 0.9 % bolus 1,000 mL (0 mLs Intravenous Stopped 04/06/19 2244)  acetaminophen (TYLENOL) tablet 650 mg (650 mg Oral Given 04/06/19 2135)     Initial Impression / Assessment and Plan / ED Course  I have reviewed the triage vital signs and the nursing notes.  Pertinent labs & imaging results that were available during my care of the patient were reviewed by me and considered in my medical decision making (see chart for details).  Clinical Course as of Apr 06 2255  Fri Apr 06, 2019  1955 Spoke with lab.  At [facility] I had originally spoke with lab regarding microscopic on the patient's urine given her  symptoms however negative dipstick.  Lab reported that they would acknowledge the add-on as soon as I placed the order.  They report that that tech went on break however they will run it immediately.   [EH]    Clinical Course User Index [EH] Lorin Glass, PA-C      Patient presents today for evaluation of dysuria, and fever.  She is concerned that she may  have pyelonephritis.  She has had temperatures up to 102 at home.  On arrival here she is tachycardic between 110-120.  She is afebrile, not tachypneic.    Initial UA with reflex microscopic did not have abnormalities other than being hazy.  I requested a complete UA with microscopic which had no bacteria.  Urine culture was sent however do not suspect UTI.  She had mild left lower quadrant tenderness to palpation on exam.  Chart review shows that she had an IUD placed 17 days ago.  She reports that she has not been sexually active since the IUD was placed.  Pelvic exam was performed where she had left-sided adnexal tenderness to palpation without clear fullness.  Based on tachycardia and fever pelvic ultrasound was ordered given recent IUD insertion.  She was given Tylenol, and 1 L of IV fluids in the emergency room.  Her initial lactic acid was very slightly elevated at 2.1, repeat has been ordered.  Her white count is not elevated and she does not have evidence of anemia, or other clinically significant hematologic or electrolyte derangements.    She did not have cervical motion tenderness, low suspicion for PID.   She does not have any known coronavirus contacts, does not have cough, shortness of breath, chest pain, sore throat, or fatigue.    At shift change care was transferred to [facility] who will follow pending studies, re-evaulate and determine disposition.      Final Clinical Impressions(s) / ED Diagnoses   Final diagnoses:  Dysuria  Fever, unspecified fever cause    ED Discharge Orders    None       Ollen Gross 04/06/19 2258    Jola Schmidt, MD 04/06/19 [phone]

## 2019-04-06 NOTE — ED Triage Notes (Signed)
Patient reports new onset fever and urinary symptoms today - urgency and frequency, as well as L flank pain - feels like she has a urinary tract or kidney infection. Took ibuprofen at 1500 today.

## 2019-04-06 NOTE — Discharge Instructions (Signed)

## 2019-04-06 NOTE — ED Provider Notes (Signed)
Care assumed from [facility], [place].  Please see her full H&P.  In short,  Sydney [name] is a 28 y.o. female presents for fever, dysuria, concern for UTI/pyelo as pt has a hx.  NO evidence of UTI on exam.  Pelvic exam by initial provider with LLQ tenderness and recent IUD placement. Labs reassuring.  Initial lactic slightly elevated.  Pt has received fluids.    Physical Exam  BP 118/81   Pulse (!) 111   Temp 99.4 F (37.4 C) (Oral)   Resp 16   LMP 03/12/2019   SpO2 98%   Physical Exam Vitals signs and nursing note reviewed.  Constitutional:      General: She is not in acute distress.    Appearance: She is well-developed.  HENT:     Head: Normocephalic.  Eyes:     General: No scleral icterus.    Conjunctiva/sclera: Conjunctivae normal.  Neck:     Musculoskeletal: Normal range of motion.  Cardiovascular:     Rate and Rhythm: Tachycardia present.  Pulmonary:     Effort: Pulmonary effort is normal.  Musculoskeletal: Normal range of motion.  Neurological:     Mental Status: She is alert.     ED Course/Procedures   Clinical Course as of Apr 06 201  Fri Apr 06, 2019  1955 Spoke with lab.  At [facility] [place] I had originally spoke with lab regarding microscopic on the patient's urine given her symptoms however negative dipstick.  Lab reported that they would acknowledge the add-on as soon as I placed the order.  They report that that tech went on break however they will run it immediately.   [EH]  2230 Plan: pending [place] and repeat lactic.     [HM]  Sat Apr 07, 2019  0201 Repeat lactic WNL  Lactic Acid, Venous: 0.7 [HM]  0201 Tachycardia resolved  Pulse Rate: 78 [HM]    Clinical Course User Index [EH] Lorin Glass, PA-C [HM] Esme Freund, Jarrett Soho, PA-C    US Transvaginal Non-ob  Result Date: 04/06/2019 CLINICAL DATA:  Initial evaluation for acute left adnexal pain. EXAM: TRANSABDOMINAL AND TRANSVAGINAL ULTRASOUND OF PELVIS DOPPLER ULTRASOUND OF OVARIES TECHNIQUE:  Both transabdominal and transvaginal ultrasound examinations of the pelvis were performed. Transabdominal technique was performed for global imaging of the pelvis including uterus, ovaries, adnexal regions, and pelvic cul-de-sac. It was necessary to proceed with endovaginal exam following the transabdominal exam to visualize the uterus, endometrium, and ovaries. Color and duplex Doppler ultrasound was utilized to evaluate blood flow to the ovaries. COMPARISON:  Prior CT from 03/07/2019 FINDINGS: Uterus Measurements: 7.4 x 3.9 x 5.3 cm = volume: 80.4 mL. No fibroids or other mass visualized. Endometrium Thickness: 11.5 mm. No focal abnormality visualized. IUD in appropriate position within the endometrial cavity. Right ovary Measurements: 3.8 x 2.2 x 2.9 cm = volume: 12.9 mL. Normal appearance/no adnexal mass. Left ovary Measurements: 4.1 x 2.5 x 3.3 cm = volume: 17.0 mL. 3.6 x 1.6 x 2.8 cm oblong simple anechoic cyst, most consistent with a normal physiologic follicular cyst/dominant follicle. Tiny internal daughter cyst noted. No other significant internal complexity or vascularity. Pulsed Doppler evaluation of both ovaries demonstrates normal low-resistance arterial and venous waveforms. Other findings No abnormal free fluid. IMPRESSION: 1. 3.6 cm simple left ovarian cyst, most consistent with a normal physiologic follicular cyst/dominant follicle. This has benign characteristics and is a common finding in premenopausal females. No imaging follow up is required. This follows consensus guidelines: Simple Adnexal Cysts: SRU Consensus  Conference Update on Follow-up and Reporting. Radiology 2019; [id number]. 2. No other acute abnormality within the pelvis. No evidence for torsion. 3. IUD in appropriate position within the endometrial cavity. Electronically Signed   By: Jeannine Boga M.D.   On: 04/06/2019 23:15   US Pelvis Complete  Result Date: 04/06/2019 CLINICAL DATA:  Initial evaluation for acute left  adnexal pain. EXAM: TRANSABDOMINAL AND TRANSVAGINAL ULTRASOUND OF PELVIS DOPPLER ULTRASOUND OF OVARIES TECHNIQUE: Both transabdominal and transvaginal ultrasound examinations of the pelvis were performed. Transabdominal technique was performed for global imaging of the pelvis including uterus, ovaries, adnexal regions, and pelvic cul-de-sac. It was necessary to proceed with endovaginal exam following the transabdominal exam to visualize the uterus, endometrium, and ovaries. Color and duplex Doppler ultrasound was utilized to evaluate blood flow to the ovaries. COMPARISON:  Prior CT from 03/07/2019 FINDINGS: Uterus Measurements: 7.4 x 3.9 x 5.3 cm = volume: 80.4 mL. No fibroids or other mass visualized. Endometrium Thickness: 11.5 mm. No focal abnormality visualized. IUD in appropriate position within the endometrial cavity. Right ovary Measurements: 3.8 x 2.2 x 2.9 cm = volume: 12.9 mL. Normal appearance/no adnexal mass. Left ovary Measurements: 4.1 x 2.5 x 3.3 cm = volume: 17.0 mL. 3.6 x 1.6 x 2.8 cm oblong simple anechoic cyst, most consistent with a normal physiologic follicular cyst/dominant follicle. Tiny internal daughter cyst noted. No other significant internal complexity or vascularity. Pulsed Doppler evaluation of both ovaries demonstrates normal low-resistance arterial and venous waveforms. Other findings No abnormal free fluid. IMPRESSION: 1. 3.6 cm simple left ovarian cyst, most consistent with a normal physiologic follicular cyst/dominant follicle. This has benign characteristics and is a common finding in premenopausal females. No imaging follow up is required. This follows consensus guidelines: Simple Adnexal Cysts: SRU Consensus Conference Update on Follow-up and Reporting. Radiology 2019; [id number]. 2. No other acute abnormality within the pelvis. No evidence for torsion. 3. IUD in appropriate position within the endometrial cavity. Electronically Signed   By: Jeannine Boga M.D.   On:  04/06/2019 23:15   [place] Art/ven Flow Abd Pelv Doppler  Result Date: 04/06/2019 CLINICAL DATA:  Initial evaluation for acute left adnexal pain. EXAM: TRANSABDOMINAL AND TRANSVAGINAL ULTRASOUND OF PELVIS DOPPLER ULTRASOUND OF OVARIES TECHNIQUE: Both transabdominal and transvaginal ultrasound examinations of the pelvis were performed. Transabdominal technique was performed for global imaging of the pelvis including uterus, ovaries, adnexal regions, and pelvic cul-de-sac. It was necessary to proceed with endovaginal exam following the transabdominal exam to visualize the uterus, endometrium, and ovaries. Color and duplex Doppler ultrasound was utilized to evaluate blood flow to the ovaries. COMPARISON:  Prior CT from 03/07/2019 FINDINGS: Uterus Measurements: 7.4 x 3.9 x 5.3 cm = volume: 80.4 mL. No fibroids or other mass visualized. Endometrium Thickness: 11.5 mm. No focal abnormality visualized. IUD in appropriate position within the endometrial cavity. Right ovary Measurements: 3.8 x 2.2 x 2.9 cm = volume: 12.9 mL. Normal appearance/no adnexal mass. Left ovary Measurements: 4.1 x 2.5 x 3.3 cm = volume: 17.0 mL. 3.6 x 1.6 x 2.8 cm oblong simple anechoic cyst, most consistent with a normal physiologic follicular cyst/dominant follicle. Tiny internal daughter cyst noted. No other significant internal complexity or vascularity. Pulsed Doppler evaluation of both ovaries demonstrates normal low-resistance arterial and venous waveforms. Other findings No abnormal free fluid. IMPRESSION: 1. 3.6 cm simple left ovarian cyst, most consistent with a normal physiologic follicular cyst/dominant follicle. This has benign characteristics and is a common finding in premenopausal females. No imaging follow up is required.  This follows consensus guidelines: Simple Adnexal Cysts: SRU Consensus Conference Update on Follow-up and Reporting. Radiology 2019; [id number]. 2. No other acute abnormality within the pelvis. No evidence for  torsion. 3. IUD in appropriate position within the endometrial cavity. Electronically Signed   By: Jeannine Boga M.D.   On: 04/06/2019 23:15     Procedures  MDM    [name] was evaluated in Emergency Department on 04/07/2019 for the symptoms described in the history of present illness. She was evaluated in the context of the global COVID-19 pandemic, which necessitated consideration that the patient might be at risk for infection with the SARS-CoV-2 virus that causes COVID-19. Institutional protocols and algorithms that pertain to the evaluation of patients at risk for COVID-19 are in a state of rapid change based on information released by regulatory bodies including the CDC and federal and state organizations. These policies and algorithms were followed during the patient's care in the ED.  Pt presents with dysuria and fever.  No evidence of pyelonephritis on UA, urine culture pending.  US pelvis with IUD in place and no evidence of TOA.  Small physiologic cyst is noted on the left ovary.  Unclear if this is the source of pain, but should not be the source of fever.  Initial lactic was elevated at 2.1 but this has cleared with fluids.  Pt's pain is controlled at this time and her tachycardia has resolved.  Pt feels comfortable with d/c home.  Discussed potential for latent infection and pt has been given strict return precautions for ongoing fever and continued or worsening pain.  She is to have close PCP and OB/GYN follow-up.  Pt states understanding and is in agreement with the plan.     Fever, unspecified fever cause  Dysuria     Gleason Ardoin, Gwenlyn Perking 04/07/19 Allena Napoleon, MD 04/07/19 [phone]

## 2019-04-07 LAB — LACTIC ACID, PLASMA: Lactic Acid, Venous: 0.7 mmol/L (ref 0.5–1.9)

## 2019-04-07 NOTE — ED Notes (Signed)
Patient verbalizes understanding of medications and discharge instructions. No further questions at this time. VSS and patient ambulatory at discharge.   

## 2019-04-08 LAB — URINE CULTURE: Culture: NO GROWTH

## 2019-04-09 LAB — GC/CHLAMYDIA PROBE AMP (~~LOC~~) NOT AT ARMC
Chlamydia: NEGATIVE
Neisseria Gonorrhea: NEGATIVE

## 2019-04-17 ENCOUNTER — Ambulatory Visit: Payer: Medicaid Other | Admitting: Women's Health

## 2019-06-06 ENCOUNTER — Other Ambulatory Visit: Payer: Self-pay | Admitting: Family Medicine

## 2019-06-06 ENCOUNTER — Encounter: Payer: Self-pay | Admitting: Family Medicine

## 2019-06-06 ENCOUNTER — Ambulatory Visit (INDEPENDENT_AMBULATORY_CARE_PROVIDER_SITE_OTHER): Payer: Medicaid Other | Admitting: Family Medicine

## 2019-06-06 ENCOUNTER — Other Ambulatory Visit: Payer: Self-pay

## 2019-06-06 VITALS — Ht 67.0 in | Wt 197.0 lb

## 2019-06-06 DIAGNOSIS — F419 Anxiety disorder, unspecified: Secondary | ICD-10-CM

## 2019-06-06 DIAGNOSIS — R350 Frequency of micturition: Secondary | ICD-10-CM | POA: Diagnosis not present

## 2019-06-06 DIAGNOSIS — F339 Major depressive disorder, recurrent, unspecified: Secondary | ICD-10-CM | POA: Diagnosis not present
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
## 2019-06-06 NOTE — Patient Instructions (Addendum)
    Thank you for completing your visit via telephone.  I appreciate the opportunity to provide you with the care for your health and wellness.  Follow-up in 5 months.  This to follow-up with you really would like you to make sure that you are care of yourself and doing things that help with management of your depression anxiety should they creep back in.  Please reach out do not hesitate wait for a while await to your next appointment to talk about this if you are feeling down or anxious.  I have attached some of the things that we talked about at your previous visit for you to review again.  If you notice any changes in your urination pattern burning back pain fevers chills please do not hesitate to reach out so that we can get a urine specimen before the weekend.  Youtube-Yoga with Adriene  Yoga, meditation, and alternate nostril breathing.  Diet: Drink a lot of water: 2L daily. Drink one cup of water before each meal.  Seasonal shopping: buy the produce that is in season, this can help make it more affordable.  Exercise: at least- 30-60 minutes 3-5 days a week. Walking   It was a pleasure to see you and I look forward to continuing to work together on your health and well-being. Please do not hesitate to call the office if you need care or have questions about your care.  Have a wonderful day and week. With Gratitude, Cherly Beach, DNP, AGNP-BC

## 2019-07-03 ENCOUNTER — Ambulatory Visit (INDEPENDENT_AMBULATORY_CARE_PROVIDER_SITE_OTHER): Payer: Medicaid Other | Admitting: Family Medicine

## 2019-07-03 ENCOUNTER — Other Ambulatory Visit: Payer: Self-pay

## 2019-07-03 ENCOUNTER — Encounter: Payer: Self-pay | Admitting: Family Medicine

## 2019-07-03 VITALS — BP 128/80 | HR 67 | Temp 98.5°F | Ht 67.0 in | Wt 200.1 lb

## 2019-07-03 DIAGNOSIS — R7301 Impaired fasting glucose: Secondary | ICD-10-CM | POA: Diagnosis not present

## 2019-07-03 DIAGNOSIS — F419 Anxiety disorder, unspecified: Secondary | ICD-10-CM | POA: Diagnosis not present

## 2019-07-03 MED ORDER — FLUOXETINE HCL 20 MG PO CAPS: 20.0000 mg | ORAL_CAPSULE | Freq: Every day | ORAL | 3 refills | Status: DC
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
## 2019-07-03 NOTE — Patient Instructions (Signed)
Thank you for coming into the office today. I appreciate the opportunity to provide you with the care for your health and wellness. Today we discussed: restart medications  Follow Up: March for annual CPE without PAP  Labs today. Will let you know the results.  See attached for walking routine and diet.   Please continue to practice social distancing to keep you, your family, and our community safe.  If you must go out, please wear a Mask and practice good handwashing.  [place] YOUR HANDS WELL AND FREQUENTLY. AVOID TOUCHING YOUR FACE, UNLESS YOUR HANDS ARE FRESHLY WASHED.  GET FRESH AIR DAILY. STAY HYDRATED WITH WATER.   It was a pleasure to see you and I look forward to continuing to work together on your health and well-being. Please do not hesitate to call the office if you need care or have questions about your care.  Have a wonderful day and week. With Gratitude, Cherly Beach, DNP, AGNP-BC   [facility]  Walking is a great form of exercise to increase your strength, endurance and overall fitness.  A walking program can help you start slowly and gradually build endurance as you go.  Everyone's ability is different, so each person's starting point will be different.  You do not have to follow them exactly.  The are just samples. You should simply find out what's right for you and stick to that program.   In the beginning, you'll start off walking 2-3 times a day for short distances.  As you get stronger, you'll be walking further at just 1-2 times per day.  A. You Can Walk For A Certain Length Of Time Each Day    Walk 5 minutes 3 times per day.  Increase 2 minutes every 2 days (3 times per day).  Work up to 25-30 minutes (1-2 times per day).   Example:   Day 1-2 5 minutes 3 times per day   Day 7-8 12 minutes 2-3 times per day   Day 13-14 25 minutes 1-2 times per day  B. You Can Walk For a Certain Distance Each Day     Distance can be substituted for  time.    Example:   3 trips to mailbox (at road)   3 trips to corner of block   3 trips around the block  C. Go to local high school and use the track.     Calorie Counting for Weight Loss Calories are units of energy. Your body needs a certain amount of calories from food to keep you going throughout the day. When you eat more calories than your body needs, your body stores the extra calories as fat. When you eat fewer calories than your body needs, your body burns fat to get the energy it needs. Calorie counting means keeping track of how many calories you eat and drink each day. Calorie counting can be helpful if you need to lose weight. If you make sure to eat fewer calories than your body needs, you should lose weight. Ask your health care provider what a healthy weight is for you. For calorie counting to work, you will need to eat the right number of calories in a day in order to lose a healthy amount of weight per week. A dietitian can help you determine how many calories you need in a day and will give you suggestions on how to reach your calorie goal.  A healthy amount of weight to lose per week is usually 1-2  lb (0.5-0.9 kg). This usually means that your daily calorie intake should be reduced by 500-750 calories.  Eating 1,200 - 1,500 calories per day can help most women lose weight.  Eating 1,500 - 1,800 calories per day can help most men lose weight. What do I need to know about calorie counting? In order to meet your daily calorie goal, you will need to:  Find out how many calories are in each food you would like to eat. Try to do this before you eat.  Decide how much of the food you plan to eat.  Write down what you ate and how many calories it had. Doing this is called keeping a food log. To successfully lose weight, it is important to balance calorie counting with a healthy lifestyle that includes regular activity. Aim for 150 minutes of moderate exercise (such as  walking) or 75 minutes of vigorous exercise (such as running) each week. Where do I find calorie information?  The number of calories in a food can be found on a Nutrition Facts label. If a food does not have a Nutrition Facts label, try to look up the calories online or ask your dietitian for help. Remember that calories are listed per serving. If you choose to have more than one serving of a food, you will have to multiply the calories per serving by the amount of servings you plan to eat. For example, the label on a package of bread might say that a serving size is 1 slice and that there are 90 calories in a serving. If you eat 1 slice, you will have eaten 90 calories. If you eat 2 slices, you will have eaten 180 calories. How do I keep a food log? Immediately after each meal, record the following information in your food log:  What you ate. Don't forget to include toppings, sauces, and other extras on the food.  How much you ate. This can be measured in cups, ounces, or number of items.  How many calories each food and drink had.  The total number of calories in the meal. Keep your food log near you, such as in a small notebook in your pocket, or use a mobile app or website. Some programs will calculate calories for you and show you how many calories you have left for the day to meet your goal. What are some calorie counting tips?   Use your calories on foods and drinks that will fill you up and not leave you hungry: ? Some examples of foods that fill you up are nuts and nut butters, vegetables, lean proteins, and high-fiber foods like whole grains. High-fiber foods are foods with more than 5 g fiber per serving. ? Drinks such as sodas, specialty coffee drinks, alcohol, and juices have a lot of calories, yet do not fill you up.  Eat nutritious foods and avoid empty calories. Empty calories are calories you get from foods or beverages that do not have many vitamins or protein, such as  candy, sweets, and soda. It is better to have a nutritious high-calorie food (such as an avocado) than a food with few nutrients (such as a bag of chips).  Know how many calories are in the foods you eat most often. This will help you calculate calorie counts faster.  Pay attention to calories in drinks. Low-calorie drinks include water and unsweetened drinks.  Pay attention to nutrition labels for "low fat" or "fat free" foods. These foods sometimes have the same amount  of calories or more calories than the full fat versions. They also often have added sugar, starch, or salt, to make up for flavor that was removed with the fat.  Find a way of tracking calories that works for you. Get creative. Try different apps or programs if writing down calories does not work for you. What are some portion control tips?  Know how many calories are in a serving. This will help you know how many servings of a certain food you can have.  Use a measuring cup to measure serving sizes. You could also try weighing out portions on a kitchen scale. With time, you will be able to estimate serving sizes for some foods.  Take some time to put servings of different foods on your favorite plates, bowls, and cups so you know what a serving looks like.  Try not to eat straight from a bag or box. Doing this can lead to overeating. Put the amount you would like to eat in a cup or on a plate to make sure you are eating the right portion.  Use smaller plates, glasses, and bowls to prevent overeating.  Try not to multitask (for example, watch TV or use your computer) while eating. If it is time to eat, sit down at a table and enjoy your food. This will help you to know when you are full. It will also help you to be aware of what you are eating and how much you are eating. What are tips for following this plan? Reading food labels  Check the calorie count compared to the serving size. The serving size may be smaller than what  you are used to eating.  Check the source of the calories. Make sure the food you are eating is high in vitamins and protein and low in saturated and trans fats. Shopping  Read nutrition labels while you shop. This will help you make healthy decisions before you decide to purchase your food.  Make a grocery list and stick to it. Cooking  Try to cook your favorite foods in a healthier way. For example, try baking instead of frying.  Use low-fat dairy products. Meal planning  Use more fruits and vegetables. Half of your plate should be fruits and vegetables.  Include lean proteins like poultry and fish. How do I count calories when eating out?  Ask for smaller portion sizes.  Consider sharing an entree and sides instead of getting your own entree.  If you get your own entree, eat only half. Ask for a box at the beginning of your meal and put the rest of your entree in it so you are not tempted to eat it.  If calories are listed on the menu, choose the lower calorie options.  Choose dishes that include vegetables, fruits, whole grains, low-fat dairy products, and lean protein.  Choose items that are boiled, broiled, grilled, or steamed. Stay away from items that are buttered, battered, fried, or served with cream sauce. Items labeled "crispy" are usually fried, unless stated otherwise.  Choose water, low-fat milk, unsweetened iced tea, or other drinks without added sugar. If you want an alcoholic beverage, choose a lower calorie option such as a glass of wine or light beer.  Ask for dressings, sauces, and syrups on the side. These are usually high in calories, so you should limit the amount you eat.  If you want a salad, choose a garden salad and ask for grilled meats. Avoid extra toppings like bacon, cheese,  or fried items. Ask for the dressing on the side, or ask for olive oil and vinegar or lemon to use as dressing.  Estimate how many servings of a food you are given. For  example, a serving of cooked rice is  cup or about the size of half a baseball. Knowing serving sizes will help you be aware of how much food you are eating at restaurants. The list below tells you how big or small some common portion sizes are based on everyday objects: ? 1 oz--4 stacked dice. ? 3 oz--1 deck of cards. ? 1 tsp--1 die. ? 1 Tbsp-- a ping-pong ball. ? 2 Tbsp--1 ping-pong ball. ?  cup-- baseball. ? 1 cup--1 baseball. Summary  Calorie counting means keeping track of how many calories you eat and drink each day. If you eat fewer calories than your body needs, you should lose weight.  A healthy amount of weight to lose per week is usually 1-2 lb (0.5-0.9 kg). This usually means reducing your daily calorie intake by 500-750 calories.  The number of calories in a food can be found on a Nutrition Facts label. If a food does not have a Nutrition Facts label, try to look up the calories online or ask your dietitian for help.  Use your calories on foods and drinks that will fill you up, and not on foods and drinks that will leave you hungry.  Use smaller plates, glasses, and bowls to prevent overeating. This information is not intended to replace advice given to you by your health care provider. Make sure you discuss any questions you have with your health care provider. Document Released: 12/13/2005 Document Revised: 09/01/2018 Document Reviewed: 11/12/2016 Elsevier Patient Education  2020 [employer].

## 2019-07-04 LAB — HEMOGLOBIN A1C
Hgb A1c MFr Bld: 5.4 % of total Hgb (ref <5.7)
Mean Plasma Glucose: 108 (calc)
eAG (mmol/L): 6 (calc)

## 2019-07-04 LAB — TSH: TSH: 0.99 mIU/L

## 2019-07-04 NOTE — Progress Notes (Signed)
Labs are good. No thyroid or prediabetic concerns at this time. Work to eat a well balanced diet as we discussed.  Drink  64 ozs of water daily. And start walking 30-60 minutes daily.

## 2019-07-16 ENCOUNTER — Other Ambulatory Visit: Payer: Self-pay

## 2019-07-16 ENCOUNTER — Ambulatory Visit (INDEPENDENT_AMBULATORY_CARE_PROVIDER_SITE_OTHER): Payer: Medicaid Other

## 2019-07-16 DIAGNOSIS — R109 Unspecified abdominal pain: Secondary | ICD-10-CM | POA: Diagnosis not present

## 2019-07-16 DIAGNOSIS — R350 Frequency of micturition: Secondary | ICD-10-CM

## 2019-07-16 LAB — POCT URINALYSIS DIP (CLINITEK)
Bilirubin, UA: NEGATIVE
Blood, UA: NEGATIVE
Glucose, UA: NEGATIVE mg/dL
Ketones, POC UA: NEGATIVE mg/dL
Leukocytes, UA: NEGATIVE
Nitrite, UA: NEGATIVE
POC PROTEIN,UA: NEGATIVE
Spec Grav, UA: 1.025 (ref 1.010–1.025)
Urobilinogen, UA: 0.2 E.U./dL
pH, UA: 5.5 (ref 5.0–8.0)

## 2019-07-16 NOTE — Progress Notes (Signed)
UA in office is good, no signs of infection.

## 2020-03-04 ENCOUNTER — Encounter: Payer: Medicaid Other | Admitting: Family Medicine

## 2020-07-11 ENCOUNTER — Emergency Department (HOSPITAL_COMMUNITY): Payer: BC Managed Care – PPO

## 2020-07-11 ENCOUNTER — Encounter (HOSPITAL_COMMUNITY): Payer: Self-pay | Admitting: Emergency Medicine

## 2020-07-11 ENCOUNTER — Emergency Department (HOSPITAL_COMMUNITY)
Admission: EM | Admit: 2020-07-11 | Discharge: 2020-07-11 | Disposition: A | Discharge status: Home / Self Care | Payer: BC Managed Care – PPO | Attending: Emergency Medicine | Admitting: Emergency Medicine

## 2020-07-11 DIAGNOSIS — R109 Unspecified abdominal pain: Secondary | ICD-10-CM | POA: Insufficient documentation

## 2020-07-11 DIAGNOSIS — Z91040 Latex allergy status: Secondary | ICD-10-CM | POA: Insufficient documentation

## 2020-07-11 DIAGNOSIS — Z7951 Long term (current) use of inhaled steroids: Secondary | ICD-10-CM | POA: Insufficient documentation

## 2020-07-11 DIAGNOSIS — J45909 Unspecified asthma, uncomplicated: Secondary | ICD-10-CM | POA: Insufficient documentation

## 2020-07-11 DIAGNOSIS — R112 Nausea with vomiting, unspecified: Secondary | ICD-10-CM | POA: Diagnosis not present

## 2020-07-11 DIAGNOSIS — Z79899 Other long term (current) drug therapy: Secondary | ICD-10-CM | POA: Insufficient documentation

## 2020-07-11 LAB — COMPREHENSIVE METABOLIC PANEL
ALT: 18 U/L (ref 0–44)
AST: 22 U/L (ref 15–41)
Albumin: 4.2 g/dL (ref 3.5–5.0)
Alkaline Phosphatase: 43 U/L (ref 38–126)
Anion gap: 7 (ref 5–15)
BUN: 11 mg/dL (ref 6–20)
CO2: 24 mmol/L (ref 22–32)
Calcium: 9 mg/dL (ref 8.9–10.3)
Chloride: 107 mmol/L (ref 98–111)
Creatinine, Ser: 0.94 mg/dL (ref 0.44–1.00)
GFR calc Af Amer: >60 mL/min (ref >60)
GFR calc non Af Amer: >60 mL/min (ref >60)
Glucose, Bld: 93 mg/dL (ref 70–99)
Potassium: 4.3 mmol/L (ref 3.5–5.1)
Sodium: 138 mmol/L (ref 135–145)
Total Bilirubin: 1.1 mg/dL (ref 0.3–1.2)
Total Protein: 7 g/dL (ref 6.5–8.1)

## 2020-07-11 LAB — CBC
HCT: 39.6 % (ref 36.0–46.0)
Hemoglobin: 13 g/dL (ref 12.0–15.0)
MCH: 30 pg (ref 26.0–34.0)
MCHC: 32.8 g/dL (ref 30.0–36.0)
MCV: 91.2 fL (ref 80.0–100.0)
Platelets: 235 10*3/uL (ref 150–400)
RBC: 4.34 MIL/uL (ref 3.87–5.11)
RDW: 12.1 % (ref 11.5–15.5)
WBC: 5.4 10*3/uL (ref 4.0–10.5)
nRBC: 0 % (ref 0.0–0.2)

## 2020-07-11 LAB — I-STAT BETA HCG BLOOD, ED (MC, WL, AP ONLY): I-stat hCG, quantitative: <5 m[IU]/mL (ref <5)

## 2020-07-11 LAB — URINALYSIS, ROUTINE W REFLEX MICROSCOPIC
Bilirubin Urine: NEGATIVE
Glucose, UA: NEGATIVE mg/dL
Hgb urine dipstick: NEGATIVE
Ketones, ur: NEGATIVE mg/dL
Leukocytes,Ua: NEGATIVE
Nitrite: NEGATIVE
Protein, ur: NEGATIVE mg/dL
Specific Gravity, Urine: 1.024 (ref 1.005–1.030)
pH: 5 (ref 5.0–8.0)

## 2020-07-11 LAB — LIPASE, BLOOD: Lipase: 46 U/L (ref 11–51)

## 2020-07-11 MED ORDER — SODIUM CHLORIDE 0.9% FLUSH
3.0000 mL | Freq: Once | INTRAVENOUS | Status: AC
  Administered 2020-07-11: 3 mL via INTRAVENOUS

## 2020-07-11 MED ORDER — KETOROLAC TROMETHAMINE 30 MG/ML IJ SOLN
30.0000 mg | Freq: Once | INTRAMUSCULAR | Status: AC
  Administered 2020-07-11: 30 mg via INTRAVENOUS
  Filled 2020-07-11: qty 1

## 2020-07-11 MED ORDER — DICYCLOMINE HCL 20 MG PO TABS: 20.0000 mg | ORAL_TABLET | Freq: Two times a day (BID) | ORAL | 0 refills | Status: DC

## 2020-07-11 MED ORDER — IOHEXOL 300 MG/ML  SOLN
100.0000 mL | Freq: Once | INTRAMUSCULAR | Status: AC | PRN
  Administered 2020-07-11: 75 mL via INTRAVENOUS
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
## 2020-07-11 NOTE — ED Notes (Signed)
Pt discharge instructions and prescriptions reviewed with the patient. The patient verbalized understanding of both. Pt discharged. 

## 2020-07-11 NOTE — Discharge Instructions (Signed)
Stick to liquids, soups, and broths for the next 2 days to give your bowel a rest.  You may be recovering from a viral stomach bug.  I expect your symptoms to improve over the next week.  Please follow up with your doctor's office in 3 days for a recheck of your symptoms.  Your CT scan did not show signs of bowel obstruction or surgical emergency.

## 2020-07-11 NOTE — ED Triage Notes (Signed)
States abd pain began 1 week ago, woke up the next day and had vomiting all day. No vomiting since last Saturday. C/o ongoing abd pain with some constipation x1 day. Denies urinary symptoms.

## 2020-08-20 ENCOUNTER — Emergency Department (HOSPITAL_COMMUNITY)
Admission: EM | Admit: 2020-08-20 | Discharge: 2020-08-20 | Disposition: A | Discharge status: Home / Self Care | Payer: BC Managed Care – PPO | Attending: Emergency Medicine | Admitting: Emergency Medicine

## 2020-08-20 ENCOUNTER — Encounter (HOSPITAL_COMMUNITY): Payer: Self-pay | Admitting: Emergency Medicine

## 2020-08-20 DIAGNOSIS — R112 Nausea with vomiting, unspecified: Secondary | ICD-10-CM | POA: Insufficient documentation

## 2020-08-20 DIAGNOSIS — R109 Unspecified abdominal pain: Secondary | ICD-10-CM

## 2020-08-20 DIAGNOSIS — J45909 Unspecified asthma, uncomplicated: Secondary | ICD-10-CM | POA: Insufficient documentation

## 2020-08-20 DIAGNOSIS — N12 Tubulo-interstitial nephritis, not specified as acute or chronic: Secondary | ICD-10-CM

## 2020-08-20 DIAGNOSIS — R3915 Urgency of urination: Secondary | ICD-10-CM | POA: Insufficient documentation

## 2020-08-20 DIAGNOSIS — Z7951 Long term (current) use of inhaled steroids: Secondary | ICD-10-CM | POA: Diagnosis not present

## 2020-08-20 DIAGNOSIS — R5383 Other fatigue: Secondary | ICD-10-CM | POA: Insufficient documentation

## 2020-08-20 LAB — URINALYSIS, ROUTINE W REFLEX MICROSCOPIC

## 2020-08-20 LAB — URINALYSIS, MICROSCOPIC (REFLEX)

## 2020-08-20 LAB — CBC WITH DIFFERENTIAL/PLATELET
Abs Immature Granulocytes: 0.02 10*3/uL (ref 0.00–0.07)
Basophils Absolute: 0 10*3/uL (ref 0.0–0.1)
Basophils Relative: 1 %
Eosinophils Absolute: 0.1 10*3/uL (ref 0.0–0.5)
Eosinophils Relative: 1 %
HCT: 40.6 % (ref 36.0–46.0)
Hemoglobin: 13.3 g/dL (ref 12.0–15.0)
Immature Granulocytes: 0 %
Lymphocytes Relative: 28 %
Lymphs Abs: 1.7 10*3/uL (ref 0.7–4.0)
MCH: 29.6 pg (ref 26.0–34.0)
MCHC: 32.8 g/dL (ref 30.0–36.0)
MCV: 90.2 fL (ref 80.0–100.0)
Monocytes Absolute: 0.5 10*3/uL (ref 0.1–1.0)
Monocytes Relative: 9 %
Neutro Abs: 3.7 10*3/uL (ref 1.7–7.7)
Neutrophils Relative %: 61 %
Platelets: 233 10*3/uL (ref 150–400)
RBC: 4.5 MIL/uL (ref 3.87–5.11)
RDW: 12.5 % (ref 11.5–15.5)
WBC: 6 10*3/uL (ref 4.0–10.5)
nRBC: 0 % (ref 0.0–0.2)

## 2020-08-20 LAB — BASIC METABOLIC PANEL
Anion gap: 10 (ref 5–15)
BUN: 8 mg/dL (ref 6–20)
CO2: 26 mmol/L (ref 22–32)
Calcium: 9.4 mg/dL (ref 8.9–10.3)
Chloride: 103 mmol/L (ref 98–111)
Creatinine, Ser: 1.08 mg/dL — ABNORMAL HIGH (ref 0.44–1.00)
GFR calc Af Amer: >60 mL/min (ref >60)
GFR calc non Af Amer: >60 mL/min (ref >60)
Glucose, Bld: 83 mg/dL (ref 70–99)
Potassium: 3.5 mmol/L (ref 3.5–5.1)
Sodium: 139 mmol/L (ref 135–145)

## 2020-08-20 LAB — PREGNANCY, URINE: Preg Test, Ur: NEGATIVE

## 2020-08-20 LAB — POC URINE PREG, ED: Preg Test, Ur: NEGATIVE

## 2020-08-20 MED ORDER — MORPHINE SULFATE (PF) 4 MG/ML IV SOLN
4.0000 mg | Freq: Once | INTRAVENOUS | Status: DC
  Filled 2020-08-20: qty 1

## 2020-08-20 MED ORDER — ONDANSETRON 4 MG PO TBDP
4.0000 mg | ORAL_TABLET | Freq: Once | ORAL | Status: AC
  Administered 2020-08-20: 4 mg via ORAL
  Filled 2020-08-20: qty 1

## 2020-08-20 MED ORDER — SODIUM CHLORIDE 0.9 % IV BOLUS
1000.0000 mL | Freq: Once | INTRAVENOUS | Status: AC
  Administered 2020-08-20: 1000 mL via INTRAVENOUS

## 2020-08-20 MED ORDER — SODIUM CHLORIDE 0.9 % IV SOLN
1.0000 g | Freq: Once | INTRAVENOUS | Status: AC
  Administered 2020-08-20: 1 g via INTRAVENOUS
  Filled 2020-08-20: qty 10

## 2020-08-20 MED ORDER — ONDANSETRON 4 MG PO TBDP
4.0000 mg | ORAL_TABLET | Freq: Three times a day (TID) | ORAL | 0 refills | Status: DC | PRN
Start: 2020-08-20 — End: 2020-10-16

## 2020-08-20 MED ORDER — SULFAMETHOXAZOLE-TRIMETHOPRIM 800-160 MG PO TABS: 1.0000 | ORAL_TABLET | Freq: Two times a day (BID) | ORAL | 0 refills | Status: AC

## 2020-08-20 MED ORDER — KETOROLAC TROMETHAMINE 30 MG/ML IJ SOLN
30.0000 mg | Freq: Once | INTRAMUSCULAR | Status: AC
  Administered 2020-08-20: 30 mg via INTRAVENOUS
  Filled 2020-08-20: qty 1

## 2020-08-20 NOTE — ED Notes (Signed)
Fixed medication administration set up. Rocephin now going in correctly.

## 2020-08-20 NOTE — ED Triage Notes (Addendum)
Pt reports some flank pain, more on the R than left yesterday, taking tylenol and ibuprofen with some relief, also endorses increased urinary urgency. States she took some amoxicillin, toradol and phenazopyridine yesterday.

## 2020-08-20 NOTE — ED Provider Notes (Signed)
[facility] EMERGENCY DEPARTMENT Provider Note   CSN: [id number] Arrival date & time: 08/20/20  0630     History Chief Complaint  Patient presents with  . Flank Pain    Sydney [name] is a 29 y.o. female who presents to the ED today with complaint of sudden onset, constant, sharp, bilateral flank pain (R > L) that began yesterday. Pt also complains of urinary urgency and chills. She has been taking Ibuprofen and Tylenol without relief. 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 Pt also took left over Toradol yesterday as well as pyridium this morning. Pt reports on her way to work this morning she began having nausea and multiple episodes of NBNB emesis prompting her to come to the ED. Pt has hx of recurrent UTIs as well as pyelonephritis. She is unsure if she has had a fever due to persistent ibuprofen and tylenol however has had chills. Pt reports this feels similar to previous pyelo. Pt had diarrhea earlier this week however did not think much of it. None recently however cannot recall last normal BM. No vaginal discharge, pelvic pain. Pt has [place] IUD in place and does not have menstrual cycles.   The history is provided by the patient and medical records.       Past Medical History:  Diagnosis Date  . Asthma    prn inhaler-mostly in spring  . Burn of left elbow 10/18/2013  . Cut 10/18/2013   right lower leg - no sutures  . History of MRSA infection age 73   thigh  . Iron deficiency anemia 09/03/2015  . Migraine headache   . PONV (postoperative nausea and vomiting)   . Postpartum care following cesarean delivery (9/7) 09/03/2015  . Salivary gland stone 09/2013   left  . Seasonal allergies    nasal congestion and sore throat 10/18/2013  . TMJ tenderness    with opening mouth wide    Patient Active Problem List   Diagnosis Date Noted  . Encounter for IUD insertion 03/20/2019  . Mild intermittent  asthma without complication   . Sepsis secondary to UTI ([place]) 03/07/2019  . Depression with anxiety 03/07/2019  . Frequent urination 12/01/2018  . Complicated UTI (urinary tract infection) 12/01/2018  . Supervision of normal pregnancy, antepartum 11/02/2016  . Hx of preeclampsia, prior pregnancy, currently pregnant 11/02/2016  . Previous cesarean section 11/02/2016    Past Surgical History:  Procedure Laterality Date  . CESAREAN SECTION N/A 09/03/2015   Procedure: CESAREAN SECTION;  Surgeon: Brien Few, MD;  Location: [facility];  Service: Obstetrics;  Laterality: N/A;  . DERMOID CYST  EXCISION Left 04/25/12   abd. wall  . LIPOMA EXCISION  07/2013   back  . SUBLINGUAL SALIVARY CYST EXCISION Left 10/24/2013   Procedure: REMOVAL OF SALIVARY GLAND STONE LEFT SIDE;  Surgeon: Ceasar Mons, DDS;  Location: [facility];  Service: Oral Surgery;  Laterality: Left;  . TONSILLECTOMY AND ADENOIDECTOMY    . WISDOM TOOTH EXTRACTION  10/2011     OB History    Gravida  2   Para  2   Term  2   Preterm      AB      Living  2     SAB      TAB      Ectopic      Multiple  0   Live Births  2  Family History  Problem Relation Age of Onset  . Depression Mother   . Anxiety disorder Mother   . Vision loss Father   . Vision loss Paternal Aunt   . Cancer Maternal Grandmother   . Depression Maternal Grandmother   . Diabetes Maternal Grandmother   . Hyperlipidemia Maternal Grandmother   . Hypertension Maternal Grandmother   . Miscarriages / Stillbirths Maternal Grandmother   . Cancer Maternal Grandfather   . Alcohol abuse Paternal Grandmother   . Depression Paternal Grandmother   . Alcohol abuse Paternal Grandfather   . Diabetes Paternal Grandfather   . Drug abuse Brother        Marijuana   . Anxiety disorder Brother   . Mental illness Brother        extensive mental history  . COPD Neg Hx   . Arthritis Neg Hx   . Asthma Neg Hx   . Early death Neg  Hx   . Hearing loss Neg Hx   . Heart disease Neg Hx   . Kidney disease Neg Hx   . Learning disabilities Neg Hx   . Mental retardation Neg Hx   . Stroke Neg Hx   . Varicose Veins Neg Hx     Social History   Tobacco Use  . Smoking status: Never Smoker  . Smokeless tobacco: Never Used  Vaping Use  . Vaping Use: Never used  Substance Use Topics  . Alcohol use: No  . Drug use: No    Home Medications Prior to Admission medications   Medication Sig Start Date End Date Taking? Authorizing Provider  albuterol (VENTOLIN HFA) 108 (90 Base) MCG/ACT inhaler Inhale 2 puffs into the lungs every 6 (six) hours as needed for wheezing or shortness of breath.    [provider]  amphetamine-dextroamphetamine (ADDERALL) 20 MG tablet Take 20 mg by mouth 2 (two) times daily.    [provider]  dicyclomine (BENTYL) 20 MG tablet Take 1 tablet (20 mg total) by mouth 2 (two) times daily for 10 doses. 07/11/20 07/16/20  Wyvonnia Dusky, MD  FLUoxetine (PROZAC) 20 MG capsule Take 1 capsule (20 mg total) by mouth daily. Patient not taking: Reported on 07/11/2020 07/03/19   Perlie Mayo, NP  fluticasone [facility]) 50 MCG/ACT nasal spray Place 1 spray into both nostrils daily as needed for allergies or rhinitis.    [provider]  Levonorgestrel (LILETTA, 52 MG, IU) 1 Device by Intrauterine route continuous.    [provider]  ondansetron (ZOFRAN ODT) 4 MG disintegrating tablet Take 1 tablet (4 mg total) by mouth every 8 (eight) hours as needed for nausea or vomiting. 08/20/20   Eustaquio Maize, PA-C  PRESCRIPTION MEDICATION Apply 1 application topically daily as needed (eczema).    [provider]  sulfamethoxazole-trimethoprim (BACTRIM DS) 800-160 MG tablet Take 1 tablet by mouth 2 (two) times daily for 10 days. 08/20/20 08/30/20  Eustaquio Maize, PA-C    Allergies    Apple, Marcelline Mates, Peanut butter flavor [flavoring agent], Fruit extracts, Latex, and Black walnut  pollen allergy skin test  Review of Systems   Review of Systems  Constitutional: Positive for chills and fatigue.  Gastrointestinal: Positive for diarrhea (resolved), nausea and vomiting.  Genitourinary: Positive for flank pain and urgency. Negative for dysuria, frequency, pelvic pain and vaginal discharge.  All other systems reviewed and are negative.   Physical Exam Updated Vital Signs BP 99/70 (BP Location: Left Arm)   Pulse 67   Temp 98.4 F (  36.9 C) (Oral)   Resp 16   Ht 5\' 7"  (1.702 m)   Wt 73.5 kg   SpO2 100%   BMI 25.37 kg/m   Physical Exam Vitals and nursing note reviewed.  Constitutional:      Appearance: She is not ill-appearing or diaphoretic.  HENT:     Head: Normocephalic and atraumatic.     Mouth/Throat:     Mouth: Mucous membranes are dry.  Eyes:     Conjunctiva/sclera: Conjunctivae normal.  Cardiovascular:     Rate and Rhythm: Normal rate and regular rhythm.     Pulses: Normal pulses.  Pulmonary:     Effort: Pulmonary effort is normal.     Breath sounds: Normal breath sounds. No wheezing, rhonchi or rales.  Abdominal:     Palpations: Abdomen is soft.     Tenderness: There is abdominal tenderness. There is right CVA tenderness and left CVA tenderness. There is no guarding or rebound.    Musculoskeletal:     Cervical back: Neck supple.  Skin:    General: Skin is warm and dry.  Neurological:     Mental Status: She is alert.     ED Results / Procedures / Treatments   Labs (all labs ordered are listed, but only abnormal results are displayed) Labs Reviewed  URINALYSIS, ROUTINE W REFLEX MICROSCOPIC - Abnormal; Notable for the following components:      Result Value   Color, Urine ORANGE (*)    Glucose, UA   (*)    Value: TEST NOT REPORTED DUE TO COLOR INTERFERENCE OF URINE PIGMENT   Hgb urine dipstick   (*)    Value: TEST NOT REPORTED DUE TO COLOR INTERFERENCE OF URINE PIGMENT   Bilirubin Urine   (*)    Value: TEST NOT REPORTED DUE TO COLOR  INTERFERENCE OF URINE PIGMENT   Ketones, ur   (*)    Value: TEST NOT REPORTED DUE TO COLOR INTERFERENCE OF URINE PIGMENT   Protein, ur   (*)    Value: TEST NOT REPORTED DUE TO COLOR INTERFERENCE OF URINE PIGMENT   Nitrite   (*)    Value: TEST NOT REPORTED DUE TO COLOR INTERFERENCE OF URINE PIGMENT   Leukocytes,Ua   (*)    Value: TEST NOT REPORTED DUE TO COLOR INTERFERENCE OF URINE PIGMENT   All other components within normal limits  URINALYSIS, MICROSCOPIC (REFLEX) - Abnormal; Notable for the following components:   Bacteria, UA FEW (*)    All other components within normal limits  BASIC METABOLIC PANEL - Abnormal; Notable for the following components:   Creatinine, Ser 1.08 (*)    All other components within normal limits  URINE CULTURE  PREGNANCY, URINE  CBC WITH DIFFERENTIAL/PLATELET  POC URINE PREG, ED    EKG None  Radiology No results found.  Procedures Procedures (including critical care time)  Medications Ordered in ED Medications  cefTRIAXone (ROCEPHIN) 1 g in sodium chloride 0.9 % 100 mL IVPB (has no administration in time range)  sodium chloride 0.9 % bolus 1,000 mL (1,000 mLs Intravenous New Bag/Given 08/20/20 1405)  ketorolac (TORADOL) 30 MG/ML injection 30 mg (30 mg Intravenous Given 08/20/20 1430)    ED Course  I have reviewed the triage vital signs and the nursing notes.  Pertinent labs & imaging results that were available during my care of the patient were reviewed by me and considered in my medical decision making (see chart for details).  Clinical Course as of Aug 20 1509  Wed Aug 20, 2020  1452 Already receiving fluids   Creatinine(!): 1.08 [MV]    Clinical Course User Index [MV] Eustaquio Maize, PA-C   MDM Rules/Calculators/A&P                          29 year old female with a history of recurrent UTIs and pyelonephritis presents to the ED today complaining of bilateral, right greater than left flank pain that started yesterday with associated  nausea and nonbloody nonbilious emesis.  On arrival to the ED patient is afebrile, nontachycardic nontachypneic.  She appears to be no acute distress.  She did unfortunately take a Pyridium prior to coming to the ED.  Urinalysis obtained while patient was in the waiting room, unable to evaluate given color change however does show WBC clumps. On exam patient has bilateral costovertebral angle tenderness to palpation as well as left-sided flank tenderness.  No obvious abdominal tenderness, no lower abdominal tenderness to suggest acute abdomen at this time.  No pelvic pain and no complaints of vaginal discharge. Patient does appear dry on exam, will plan for pain meds and fluids.  Will obtain CBC and BMP to assess for kidney function and leukocytosis.  Given patient is young and has history of Pyelo and states this feels very similar I will plan to treat clinically for pyelonephritis and hold off on CT scanning young patient.  Urine pregnancy negative.   CBC without leukocytosis, white blood cell count 6.0.  Hemoglobin stable at 13.3.  BMP with creatinine 1.08 which is slightly elevated compared to baseline. Pt already receiving fluids.   Lab Results  Component Value Date   CREATININE 1.08 (H) 08/20/2020   CREATININE 0.94 07/11/2020   CREATININE 0.85 04/06/2019   On reevaluation pt resting comfortably. She has not received the rocephin yet. Will plan to discharge afterwards. Per chart review pt admitted for sepsis s/2 pyelo in March 2020 with urine culture that grew E coli with Bactrim being sensitive. Will plan to dispo with same. Pt instructed to follow up with her PCP in 1-2 days for repeat. Strict return precautions discussed with pt. She is in agreement with plan and stable for discharge home.   This note was prepared using Dragon voice recognition software and may include unintentional dictation errors due to the inherent limitations of voice recognition software.  Final Clinical Impression(s) /  ED Diagnoses Final diagnoses:  Pyelonephritis  Flank pain    Rx / DC Orders ED Discharge Orders         Ordered    sulfamethoxazole-trimethoprim (BACTRIM DS) 800-160 MG tablet  2 times daily        08/20/20 1509    ondansetron (ZOFRAN ODT) 4 MG disintegrating tablet  Every 8 hours PRN        08/20/20 1509           Eustaquio Maize, PA-C 08/20/20 1510    Little, Wenda Overland, MD 08/20/20 1526

## 2020-08-20 NOTE — Discharge Instructions (Addendum)
Please pick up antibiotics and take as prescribed for the next 10 days. I have also prescribed nausea medication for you. It is recommended that you take 600 mg Ibuprofen every 6-8 hours and 1,000 mg Tylenol every 8 hours as needed for pain.   Follow up with your PCP in 1-2 days regarding your ED visit and for recheck.   Return to the ED IMMEDIATELY for any worsening symptoms including persistent fevers > 100.4, persistent vomiting, worsening pain, or any other new/concerning symptoms.

## 2020-08-20 NOTE — ED Notes (Signed)
Went over discharge papers with pt. All questions answered.

## 2020-08-21 LAB — URINE CULTURE: Culture: NO GROWTH

## 2020-10-03 ENCOUNTER — Other Ambulatory Visit: Payer: Self-pay

## 2020-10-03 ENCOUNTER — Emergency Department (HOSPITAL_COMMUNITY)
Admission: EM | Admit: 2020-10-03 | Discharge: 2020-10-03 | Disposition: A | Discharge status: Home / Self Care | Payer: BC Managed Care – PPO | Attending: Emergency Medicine | Admitting: Emergency Medicine

## 2020-10-03 ENCOUNTER — Encounter (HOSPITAL_COMMUNITY): Payer: Self-pay

## 2020-10-03 ENCOUNTER — Emergency Department (HOSPITAL_COMMUNITY): Payer: BC Managed Care – PPO

## 2020-10-03 DIAGNOSIS — R109 Unspecified abdominal pain: Secondary | ICD-10-CM

## 2020-10-03 DIAGNOSIS — N390 Urinary tract infection, site not specified: Secondary | ICD-10-CM | POA: Insufficient documentation

## 2020-10-03 DIAGNOSIS — J4520 Mild intermittent asthma, uncomplicated: Secondary | ICD-10-CM | POA: Diagnosis not present

## 2020-10-03 DIAGNOSIS — Z91040 Latex allergy status: Secondary | ICD-10-CM | POA: Diagnosis not present

## 2020-10-03 DIAGNOSIS — Z91010 Allergy to peanuts: Secondary | ICD-10-CM | POA: Diagnosis not present

## 2020-10-03 DIAGNOSIS — R300 Dysuria: Secondary | ICD-10-CM | POA: Insufficient documentation

## 2020-10-03 DIAGNOSIS — T50905A Adverse effect of unspecified drugs, medicaments and biological substances, initial encounter: Secondary | ICD-10-CM

## 2020-10-03 LAB — CBC
HCT: 37.5 % (ref 36.0–46.0)
Hemoglobin: 12.4 g/dL (ref 12.0–15.0)
MCH: 30.5 pg (ref 26.0–34.0)
MCHC: 33.1 g/dL (ref 30.0–36.0)
MCV: 92.1 fL (ref 80.0–100.0)
Platelets: 229 10*3/uL (ref 150–400)
RBC: 4.07 MIL/uL (ref 3.87–5.11)
RDW: 12.6 % (ref 11.5–15.5)
WBC: 5.2 10*3/uL (ref 4.0–10.5)
nRBC: 0 % (ref 0.0–0.2)

## 2020-10-03 LAB — URINALYSIS, ROUTINE W REFLEX MICROSCOPIC
Bilirubin Urine: NEGATIVE
Glucose, UA: NEGATIVE mg/dL
Hgb urine dipstick: NEGATIVE
Ketones, ur: NEGATIVE mg/dL
Leukocytes,Ua: NEGATIVE
Nitrite: NEGATIVE
Protein, ur: NEGATIVE mg/dL
Specific Gravity, Urine: 1.018 (ref 1.005–1.030)
pH: 5 (ref 5.0–8.0)

## 2020-10-03 LAB — BASIC METABOLIC PANEL
Anion gap: 11 (ref 5–15)
BUN: 10 mg/dL (ref 6–20)
CO2: 23 mmol/L (ref 22–32)
Calcium: 9 mg/dL (ref 8.9–10.3)
Chloride: 102 mmol/L (ref 98–111)
Creatinine, Ser: 0.86 mg/dL (ref 0.44–1.00)
GFR, Estimated: >60 mL/min (ref >60)
Glucose, Bld: 127 mg/dL — ABNORMAL HIGH (ref 70–99)
Potassium: 3.4 mmol/L — ABNORMAL LOW (ref 3.5–5.1)
Sodium: 136 mmol/L (ref 135–145)

## 2020-10-03 LAB — I-STAT BETA HCG BLOOD, ED (MC, WL, AP ONLY): I-stat hCG, quantitative: <5 m[IU]/mL (ref <5)

## 2020-10-03 NOTE — Discharge Instructions (Addendum)
You may have side effect from taking Macrobid so please stop taking it.  Please follow-up with your urologist as  Return to ER if you have worse flank pain, abdominal pain, vomiting, fevers, trouble urinating

## 2020-10-03 NOTE — ED Notes (Signed)
Patient verbalizes understanding of discharge instructions. Opportunity for questioning and answers were provided. Armband removed by staff, pt discharged from ED ambulatory.   

## 2020-10-03 NOTE — ED Triage Notes (Signed)
Pt arrives to ED w/ c/o UTI that started on sept 29. Pt started on ABX however symptoms have worsened. Pt states urine cultures + for e-coli. + s/s.

## 2020-10-03 NOTE — ED Provider Notes (Signed)
[facility]PART[facility] Provider Note   CSN: [id number] Arrival date & time: 10/03/20  1338     History Chief Complaint  Patient presents with  . Urinary Tract Infection    Sydney [name] is a 29 y.o. female history of recurrent UTIs who presented with flank pain.  Patient has recurrent chronic UTIs.  Patient has some dysuria.  Patient went to urgent care on September 29 and was diagnosed with UTI.  Patient was on Cipro and urine culture came back for E. coli.  Patient was switched to [facility] on October 4.  She has been taking Macrobid as prescribed and starting to have multiple symptoms.  She developed and a notebook and states that she has been having headaches and bilateral flank pain. She also has several episodes of vomiting as well.  She has no fever and no dysuria.  Is supposed to see urology for her chronic UTIs and came here for further evaluation.  The history is provided by the patient.       Past Medical History:  Diagnosis Date  . Asthma    prn inhaler-mostly in spring  . Burn of left elbow 10/18/2013  . Cut 10/18/2013   right lower leg - no sutures  . History of MRSA infection age 33   thigh  . Iron deficiency anemia 09/03/2015  . Migraine headache   . PONV (postoperative nausea and vomiting)   . Postpartum care following cesarean delivery (9/7) 09/03/2015  . Salivary gland stone 09/2013   left  . Seasonal allergies    nasal congestion and sore throat 10/18/2013  . TMJ tenderness    with opening mouth wide    Patient Active Problem List   Diagnosis Date Noted  . Encounter for IUD insertion 03/20/2019  . Mild intermittent asthma without complication   . Sepsis secondary to UTI ([place]) 03/07/2019  . Depression with anxiety 03/07/2019  . Frequent urination 12/01/2018  . Complicated UTI (urinary tract infection) 12/01/2018  . Supervision of normal pregnancy, antepartum 11/02/2016  . Hx of preeclampsia, prior pregnancy, currently  pregnant 11/02/2016  . Previous cesarean section 11/02/2016    Past Surgical History:  Procedure Laterality Date  . CESAREAN SECTION N/A 09/03/2015   Procedure: CESAREAN SECTION;  Surgeon: Brien Few, MD;  Location: [facility];  Service: Obstetrics;  Laterality: N/A;  . DERMOID CYST  EXCISION Left 04/25/12   abd. wall  . LIPOMA EXCISION  07/2013   back  . SUBLINGUAL SALIVARY CYST EXCISION Left 10/24/2013   Procedure: REMOVAL OF SALIVARY GLAND STONE LEFT SIDE;  Surgeon: Ceasar Mons, DDS;  Location: [facility];  Service: Oral Surgery;  Laterality: Left;  . TONSILLECTOMY AND ADENOIDECTOMY    . WISDOM TOOTH EXTRACTION  10/2011     OB History    Gravida  2   Para  2   Term  2   Preterm      AB      Living  2     SAB      TAB      Ectopic      Multiple  0   Live Births  2           Family History  Problem Relation Age of Onset  . Depression Mother   . Anxiety disorder Mother   . Vision loss Father   . Vision loss Paternal Aunt   . Cancer Maternal Grandmother   . Depression Maternal Grandmother   .  Diabetes Maternal Grandmother   . Hyperlipidemia Maternal Grandmother   . Hypertension Maternal Grandmother   . Miscarriages / Stillbirths Maternal Grandmother   . Cancer Maternal Grandfather   . Alcohol abuse Paternal Grandmother   . Depression Paternal Grandmother   . Alcohol abuse Paternal Grandfather   . Diabetes Paternal Grandfather   . Drug abuse Brother        Marijuana   . Anxiety disorder Brother   . Mental illness Brother        extensive mental history  . COPD Neg Hx   . Arthritis Neg Hx   . Asthma Neg Hx   . Early death Neg Hx   . Hearing loss Neg Hx   . Heart disease Neg Hx   . Kidney disease Neg Hx   . Learning disabilities Neg Hx   . Mental retardation Neg Hx   . Stroke Neg Hx   . Varicose Veins Neg Hx     Social History   Tobacco Use  . Smoking status: Never Smoker  . Smokeless tobacco: Never Used  Vaping Use  .  Vaping Use: Never used  Substance Use Topics  . Alcohol use: No  . Drug use: No    Home Medications Prior to Admission medications   Medication Sig Start Date End Date Taking? Authorizing Provider  albuterol (VENTOLIN HFA) 108 (90 Base) MCG/ACT inhaler Inhale 2 puffs into the lungs every 6 (six) hours as needed for wheezing or shortness of breath.    [provider]  amphetamine-dextroamphetamine (ADDERALL) 20 MG tablet Take 20 mg by mouth 2 (two) times daily.    [provider]  dicyclomine (BENTYL) 20 MG tablet Take 1 tablet (20 mg total) by mouth 2 (two) times daily for 10 doses. 07/11/20 07/16/20  Wyvonnia Dusky, MD  FLUoxetine (PROZAC) 20 MG capsule Take 1 capsule (20 mg total) by mouth daily. Patient not taking: Reported on 07/11/2020 07/03/19   Perlie Mayo, NP  fluticasone [facility]) 50 MCG/ACT nasal spray Place 1 spray into both nostrils daily as needed for allergies or rhinitis.    [provider]  Levonorgestrel (LILETTA, 52 MG, IU) 1 Device by Intrauterine route continuous.    [provider]  ondansetron (ZOFRAN ODT) 4 MG disintegrating tablet Take 1 tablet (4 mg total) by mouth every 8 (eight) hours as needed for nausea or vomiting. 08/20/20   Eustaquio Maize, PA-C  PRESCRIPTION MEDICATION Apply 1 application topically daily as needed (eczema).    [provider]    Allergies    Apple, Cherry, Peanut butter flavor [flavoring agent], Fruit extracts, Latex, and Black walnut pollen allergy skin test  Review of Systems   Review of Systems  Genitourinary: Positive for flank pain.  All other systems reviewed and are negative.   Physical Exam Updated Vital Signs BP 118/74 (BP Location: Right Arm)   Pulse (!) 110   Temp 98 F (36.7 C)   Resp 14   Ht 5\' 7"  (1.702 m)   Wt 72.6 kg   SpO2 100%   BMI 25.06 kg/m   Physical Exam Vitals and nursing note reviewed.  Constitutional:      Appearance: Normal appearance.  HENT:      Head: Normocephalic.     Nose: Nose normal.     Mouth/Throat:     Mouth: Mucous membranes are moist.  Eyes:     Extraocular Movements: Extraocular movements intact.     Pupils: Pupils are equal, round, and reactive  to light.  Cardiovascular:     Rate and Rhythm: Normal rate and regular rhythm.     Pulses: Normal pulses.     Heart sounds: Normal heart sounds.  Pulmonary:     Effort: Pulmonary effort is normal.     Breath sounds: Normal breath sounds.  Abdominal:     General: Abdomen is flat.     Palpations: Abdomen is soft.     Comments: Mild L CVAT   Musculoskeletal:        General: Normal range of motion.     Cervical back: Normal range of motion.  Skin:    General: Skin is warm.     Capillary Refill: Capillary refill takes less than 2 seconds.  Neurological:     General: No focal deficit present.     Mental Status: She is alert and oriented to person, place, and time.  Psychiatric:        Mood and Affect: Mood normal.        Behavior: Behavior normal.     ED Results / Procedures / Treatments   Labs (all labs ordered are listed, but only abnormal results are displayed) Labs Reviewed  BASIC METABOLIC PANEL - Abnormal; Notable for the following components:      Result Value   Potassium 3.4 (*)    Glucose, Bld 127 (*)    All other components within normal limits  URINALYSIS, ROUTINE W REFLEX MICROSCOPIC  CBC  I-STAT BETA HCG BLOOD, ED ([facility], [facility], [facility] ONLY)    EKG None  Radiology US Renal  Result Date: 10/03/2020 CLINICAL DATA:  Bilateral flank pain EXAM: RENAL / URINARY TRACT ULTRASOUND COMPLETE COMPARISON:  CT 07/11/2020 FINDINGS: Right Kidney: Renal measurements: 10.6 x 3.9 x 6.1 cm = volume: 132.5 mL. Echogenicity within normal limits. No mass or hydronephrosis visualized. Left Kidney: Renal measurements: 11.3 x 5.6 x 7 cm = volume: 233.2 mL. Echogenicity within normal limits. No mass or hydronephrosis visualized. Bladder: Appears normal for degree of bladder  distention. Other: None. IMPRESSION: Negative renal ultrasound Electronically Signed   By: Donavan Foil M.D.   On: 10/03/2020 18:55    Procedures Procedures (including critical care time)  Medications Ordered in ED Medications - No data to display  ED Course  I have reviewed the triage vital signs and the nursing notes.  Pertinent labs & imaging results that were available during my care of the patient were reviewed by me and considered in my medical decision making (see chart for details).    MDM Rules/Calculators/A&P                         Sydney [name] is a 29 y.o. female history of chronic UTIs here presenting with flank pain.  Patient is already on [place] for her UTI.  Her urinalysis today is normal.  Unclear why she has flank pain.  Will get renal ultrasound to rule out hydro or any mass.  CBC and BMP also unremarkable.  7:35 PM  Renal ultrasound unremarkable.  She has urology follow-up already.  Stable for discharge.  Final Clinical Impression(s) / ED Diagnoses Final diagnoses:  None    Rx / DC Orders ED Discharge Orders    None       Drenda Freeze, MD 10/03/20 [phone]

## 2020-10-05 ENCOUNTER — Encounter (HOSPITAL_COMMUNITY): Payer: Self-pay | Admitting: Emergency Medicine

## 2020-10-05 ENCOUNTER — Emergency Department (HOSPITAL_COMMUNITY)
Admission: EM | Admit: 2020-10-05 | Discharge: 2020-10-06 | Disposition: A | Discharge status: Home / Self Care | Payer: BC Managed Care – PPO | Attending: Emergency Medicine | Admitting: Emergency Medicine

## 2020-10-05 ENCOUNTER — Other Ambulatory Visit: Payer: Self-pay

## 2020-10-05 DIAGNOSIS — Z91040 Latex allergy status: Secondary | ICD-10-CM | POA: Diagnosis not present

## 2020-10-05 DIAGNOSIS — N390 Urinary tract infection, site not specified: Secondary | ICD-10-CM | POA: Insufficient documentation

## 2020-10-05 DIAGNOSIS — R300 Dysuria: Secondary | ICD-10-CM | POA: Diagnosis present

## 2020-10-05 DIAGNOSIS — J4520 Mild intermittent asthma, uncomplicated: Secondary | ICD-10-CM | POA: Diagnosis not present

## 2020-10-05 DIAGNOSIS — Z91010 Allergy to peanuts: Secondary | ICD-10-CM | POA: Insufficient documentation

## 2020-10-05 LAB — CBC
HCT: 39.2 % (ref 36.0–46.0)
Hemoglobin: 12.8 g/dL (ref 12.0–15.0)
MCH: 30 pg (ref 26.0–34.0)
MCHC: 32.7 g/dL (ref 30.0–36.0)
MCV: 92 fL (ref 80.0–100.0)
Platelets: 240 10*3/uL (ref 150–400)
RBC: 4.26 MIL/uL (ref 3.87–5.11)
RDW: 12.6 % (ref 11.5–15.5)
WBC: 6.4 10*3/uL (ref 4.0–10.5)
nRBC: 0 % (ref 0.0–0.2)

## 2020-10-05 LAB — URINALYSIS, ROUTINE W REFLEX MICROSCOPIC
Bilirubin Urine: NEGATIVE
Glucose, UA: NEGATIVE mg/dL
Hgb urine dipstick: NEGATIVE
Ketones, ur: NEGATIVE mg/dL
Nitrite: NEGATIVE
Protein, ur: NEGATIVE mg/dL
Specific Gravity, Urine: 1.008 (ref 1.005–1.030)
WBC, UA: >50 WBC/hpf — ABNORMAL HIGH (ref 0–5)
pH: 8 (ref 5.0–8.0)

## 2020-10-05 LAB — BASIC METABOLIC PANEL
Anion gap: 9 (ref 5–15)
BUN: 6 mg/dL (ref 6–20)
CO2: 25 mmol/L (ref 22–32)
Calcium: 9 mg/dL (ref 8.9–10.3)
Chloride: 105 mmol/L (ref 98–111)
Creatinine, Ser: 0.88 mg/dL (ref 0.44–1.00)
GFR, Estimated: >60 mL/min (ref >60)
Glucose, Bld: 83 mg/dL (ref 70–99)
Potassium: 4 mmol/L (ref 3.5–5.1)
Sodium: 139 mmol/L (ref 135–145)

## 2020-10-05 LAB — I-STAT BETA HCG BLOOD, ED (MC, WL, AP ONLY): I-stat hCG, quantitative: <5 m[IU]/mL (ref <5)

## 2020-10-05 MED ORDER — CEPHALEXIN 250 MG PO CAPS
500.0000 mg | ORAL_CAPSULE | ORAL | Status: AC
  Administered 2020-10-06: 500 mg via ORAL
  Filled 2020-10-05: qty 2

## 2020-10-05 MED ORDER — PHENAZOPYRIDINE HCL 100 MG PO TABS
200.0000 mg | ORAL_TABLET | Freq: Once | ORAL | Status: AC
  Administered 2020-10-06: 200 mg via ORAL
  Filled 2020-10-05: qty 2

## 2020-10-05 MED ORDER — CEPHALEXIN 500 MG PO CAPS: 500.0000 mg | ORAL_CAPSULE | Freq: Four times a day (QID) | ORAL | 0 refills | Status: DC

## 2020-10-05 MED ORDER — PHENAZOPYRIDINE HCL 200 MG PO TABS: 200.0000 mg | ORAL_TABLET | Freq: Three times a day (TID) | ORAL | 0 refills | Status: DC | PRN

## 2020-10-05 NOTE — ED Provider Notes (Signed)
[facility] EMERGENCY DEPARTMENT Provider Note   CSN: [id number] Arrival date & time: 10/05/20  1520     History Chief Complaint  Patient presents with  . Urinary Tract Infection    Sydney [name] is a 29 y.o. female.  The history is provided by the patient.  Urinary Tract Infection Pain quality:  Burning Pain severity:  Moderate Onset quality:  Gradual Duration:  1 day Timing:  Constant Progression:  Unchanged Chronicity:  Recurrent Recent urinary tract infections: yes   Relieved by:  Nothing Worsened by:  Nothing Ineffective treatments:  None tried Urinary symptoms: no discolored urine   Associated symptoms: no abdominal pain and no fever   Risk factors: hx of pyelonephritis   Risk factors: no renal disease   Patient with chronic UTI who was on macrobid until 2 days ago presents with dysuria.  Patient is supposed to follow up with Urology but hasn't yet.  No f/c/r.  No nausea nor vomiting.      Past Medical History:  Diagnosis Date  . Asthma    prn inhaler-mostly in spring  . Burn of left elbow 10/18/2013  . Cut 10/18/2013   right lower leg - no sutures  . History of MRSA infection age 44   thigh  . Iron deficiency anemia 09/03/2015  . Migraine headache   . PONV (postoperative nausea and vomiting)   . Postpartum care following cesarean delivery (9/7) 09/03/2015  . Salivary gland stone 09/2013   left  . Seasonal allergies    nasal congestion and sore throat 10/18/2013  . TMJ tenderness    with opening mouth wide    Patient Active Problem List   Diagnosis Date Noted  . Encounter for IUD insertion 03/20/2019  . Mild intermittent asthma without complication   . Sepsis secondary to UTI ([place]) 03/07/2019  . Depression with anxiety 03/07/2019  . Frequent urination 12/01/2018  . Complicated UTI (urinary tract infection) 12/01/2018  . Supervision of normal pregnancy, antepartum 11/02/2016  . Hx of preeclampsia, prior pregnancy, currently  pregnant 11/02/2016  . Previous cesarean section 11/02/2016    Past Surgical History:  Procedure Laterality Date  . CESAREAN SECTION N/A 09/03/2015   Procedure: CESAREAN SECTION;  Surgeon: Brien Few, MD;  Location: [facility];  Service: Obstetrics;  Laterality: N/A;  . DERMOID CYST  EXCISION Left 04/25/12   abd. wall  . LIPOMA EXCISION  07/2013   back  . SUBLINGUAL SALIVARY CYST EXCISION Left 10/24/2013   Procedure: REMOVAL OF SALIVARY GLAND STONE LEFT SIDE;  Surgeon: Ceasar Mons, DDS;  [facility];  Service: Oral Surgery;  Laterality: Left;  . TONSILLECTOMY AND ADENOIDECTOMY    . WISDOM TOOTH EXTRACTION  10/2011     OB History    Gravida  2   Para  2   Term  2   Preterm      AB      Living  2     SAB      TAB      Ectopic      Multiple  0   Live Births  2           Family History  Problem Relation Age of Onset  . Depression Mother   . Anxiety disorder Mother   . Vision loss Father   . Vision loss Paternal Aunt   . Cancer Maternal Grandmother   . Depression Maternal Grandmother   . Diabetes Maternal Grandmother   .  Hyperlipidemia Maternal Grandmother   . Hypertension Maternal Grandmother   . Miscarriages / Stillbirths Maternal Grandmother   . Cancer Maternal Grandfather   . Alcohol abuse Paternal Grandmother   . Depression Paternal Grandmother   . Alcohol abuse Paternal Grandfather   . Diabetes Paternal Grandfather   . Drug abuse Brother        Marijuana   . Anxiety disorder Brother   . Mental illness Brother        extensive mental history  . COPD Neg Hx   . Arthritis Neg Hx   . Asthma Neg Hx   . Early death Neg Hx   . Hearing loss Neg Hx   . Heart disease Neg Hx   . Kidney disease Neg Hx   . Learning disabilities Neg Hx   . Mental retardation Neg Hx   . Stroke Neg Hx   . Varicose Veins Neg Hx     Social History   Tobacco Use  . Smoking status: Never Smoker  . Smokeless tobacco: Never Used  Vaping Use  .  Vaping Use: Never used  Substance Use Topics  . Alcohol use: No  . Drug use: No    Home Medications Prior to Admission medications   Medication Sig Start Date End Date Taking? Authorizing Provider  albuterol (VENTOLIN HFA) 108 (90 Base) MCG/ACT inhaler Inhale 2 puffs into the lungs every 6 (six) hours as needed for wheezing or shortness of breath.    [provider]  amphetamine-dextroamphetamine (ADDERALL) 20 MG tablet Take 20 mg by mouth 2 (two) times daily.    [provider]  cephALEXin (KEFLEX) 500 MG capsule Take 1 capsule (500 mg total) by mouth 4 (four) times daily. 10/05/20   Pier Bosher, MD  dicyclomine (BENTYL) 20 MG tablet Take 1 tablet (20 mg total) by mouth 2 (two) times daily for 10 doses. 07/11/20 07/16/20  Wyvonnia Dusky, MD  FLUoxetine (PROZAC) 20 MG capsule Take 1 capsule (20 mg total) by mouth daily. Patient not taking: Reported on 07/11/2020 07/03/19   Perlie Mayo, NP  fluticasone [facility]) 50 MCG/ACT nasal spray Place 1 spray into both nostrils daily as needed for allergies or rhinitis.    [provider]  Levonorgestrel (LILETTA, 52 MG, IU) 1 Device by Intrauterine route continuous.    [provider]  ondansetron (ZOFRAN ODT) 4 MG disintegrating tablet Take 1 tablet (4 mg total) by mouth every 8 (eight) hours as needed for nausea or vomiting. 08/20/20   Alroy Bailiff, Margaux, PA-C  phenazopyridine (PYRIDIUM) 200 MG tablet Take 1 tablet (200 mg total) by mouth 3 (three) times daily as needed for pain. 10/05/20   Trilby Way, MD  PRESCRIPTION MEDICATION Apply 1 application topically daily as needed (eczema).    [provider]    Allergies    Apple, Cherry, Peanut butter flavor [flavoring agent], Fruit extracts, Latex, and Black walnut pollen allergy skin test  Review of Systems   Review of Systems  Constitutional: Negative for fever.  HENT: Negative for congestion.   Eyes: Negative for visual disturbance.  Respiratory:  Negative for shortness of breath.   Cardiovascular: Negative for chest pain.  Gastrointestinal: Negative for abdominal pain.  Genitourinary: Positive for dysuria. Negative for difficulty urinating and hematuria.  Musculoskeletal: Negative for arthralgias.  Skin: Negative for rash.  Neurological: Negative for dizziness.  Psychiatric/Behavioral: Negative for agitation.  All other systems reviewed and are negative.   Physical Exam Updated Vital Signs BP 115/80 (BP Location: Right  Arm)   Pulse 91   Temp 98.9 F (37.2 C) (Oral)   Resp 18   SpO2 100%   Physical Exam Vitals and nursing note reviewed.  Constitutional:      General: She is not in acute distress.    Appearance: Normal appearance.  HENT:     Head: Normocephalic and atraumatic.     Nose: Nose normal.  Eyes:     Conjunctiva/sclera: Conjunctivae normal.     Pupils: Pupils are equal, round, and reactive to light.  Cardiovascular:     Rate and Rhythm: Normal rate and regular rhythm.     Pulses: Normal pulses.     Heart sounds: Normal heart sounds.  Pulmonary:     Effort: Pulmonary effort is normal.     Breath sounds: Normal breath sounds.  Abdominal:     General: Abdomen is flat. Bowel sounds are normal.     Palpations: Abdomen is soft.     Tenderness: There is no abdominal tenderness. There is no guarding.  Musculoskeletal:        General: Normal range of motion.     Cervical back: Normal range of motion and neck supple.  Skin:    General: Skin is warm and dry.     Capillary Refill: Capillary refill takes less than 2 seconds.  Neurological:     General: No focal deficit present.     Mental Status: She is alert and oriented to person, place, and time.     Deep Tendon Reflexes: Reflexes normal.  Psychiatric:        Mood and Affect: Mood normal.        Behavior: Behavior normal.     ED Results / Procedures / Treatments   Labs (all labs ordered are listed, but only abnormal results are displayed) Results for  orders placed or performed during the hospital encounter of 10/05/20  Urinalysis, Routine w reflex microscopic Urine, Clean Catch  Result Value Ref Range   Color, Urine STRAW (A) YELLOW   APPearance HAZY (A) CLEAR   Specific Gravity, Urine 1.008 1.005 - 1.030   pH 8.0 5.0 - 8.0   Glucose, UA NEGATIVE NEGATIVE mg/dL   Hgb urine dipstick NEGATIVE NEGATIVE   Bilirubin Urine NEGATIVE NEGATIVE   Ketones, ur NEGATIVE NEGATIVE mg/dL   Protein, ur NEGATIVE NEGATIVE mg/dL   Nitrite NEGATIVE NEGATIVE   Leukocytes,Ua LARGE (A) NEGATIVE   RBC / HPF 0-5 0 - 5 RBC/hpf   WBC, UA >50 (H) 0 - 5 WBC/hpf   Bacteria, UA FEW (A) NONE SEEN   Squamous Epithelial / LPF 0-5 0 - 5  Basic metabolic panel  Result Value Ref Range   Sodium 139 135 - 145 mmol/L   Potassium 4.0 3.5 - 5.1 mmol/L   Chloride 105 98 - 111 mmol/L   CO2 25 22 - 32 mmol/L   Glucose, Bld 83 70 - 99 mg/dL   BUN 6 6 - 20 mg/dL   Creatinine, Ser 0.88 0.44 - 1.00 mg/dL   Calcium 9.0 8.9 - 10.3 mg/dL   GFR, Estimated >60 >60 mL/min   Anion gap 9 5 - 15  CBC  Result Value Ref Range   WBC 6.4 4.0 - 10.5 K/uL   RBC 4.26 3.87 - 5.11 MIL/uL   Hemoglobin 12.8 12.0 - 15.0 g/dL   HCT 39.2 36 - 46 %   MCV 92.0 80.0 - 100.0 fL   MCH 30.0 26.0 - 34.0 pg   MCHC 32.7 30.0 -  36.0 g/dL   RDW 12.6 11.5 - 15.5 %   Platelets 240 150 - 400 K/uL   nRBC 0.0 0.0 - 0.2 %  I-Stat beta hCG blood, ED  Result Value Ref Range   I-stat hCG, quantitative <5.0 <5 mIU/mL   Comment 3           US Renal  Result Date: 10/03/2020 CLINICAL DATA:  Bilateral flank pain EXAM: RENAL / URINARY TRACT ULTRASOUND COMPLETE COMPARISON:  CT 07/11/2020 FINDINGS: Right Kidney: Renal measurements: 10.6 x 3.9 x 6.1 cm = volume: 132.5 mL. Echogenicity within normal limits. No mass or hydronephrosis visualized. Left Kidney: Renal measurements: 11.3 x 5.6 x 7 cm = volume: 233.2 mL. Echogenicity within normal limits. No mass or hydronephrosis visualized. Bladder: Appears normal for  degree of bladder distention. Other: None. IMPRESSION: Negative renal ultrasound Electronically Signed   By: Donavan Foil M.D.   On: 10/03/2020 18:55    Radiology No results found.  Procedures Procedures (including critical care time)  Medications Ordered in ED Medications  cephALEXin (KEFLEX) capsule 500 mg (has no administration in time range)  phenazopyridine (PYRIDIUM) tablet 200 mg (has no administration in time range)    ED Course  I have reviewed the triage vital signs and the nursing notes.  Pertinent labs & imaging results that were available during my care of the patient were reviewed by me and considered in my medical decision making (see chart for details).   Patient declines rocephin in the ED.    No signs of of sepsis or systemic symptoms.  Patient is well appearing.    Will start Keflex x 10 days and have instructed patient to call urology in the am for follow up.    Sydney [name] was evaluated in Emergency Department on 10/06/2020 for the symptoms described in the history of present illness. She was evaluated in the context of the global COVID-19 pandemic, which necessitated consideration that the patient might be at risk for infection with the SARS-CoV-2 virus that causes COVID-19. Institutional protocols and algorithms that pertain to the evaluation of patients at risk for COVID-19 are in a state of rapid change based on information released by regulatory bodies including the CDC and federal and state organizations. These policies and algorithms were followed during the patient's care in the ED.  Final Clinical Impression(s) / ED Diagnoses Final diagnoses:  Lower urinary tract infectious disease   Return for intractable cough, coughing up blood,fevers >100.4 unrelieved by medication, shortness of breath, intractable vomiting, chest pain, shortness of breath, weakness,numbness, changes in speech, facial asymmetry,abdominal pain, passing out,Inability to  tolerate liquids or food, cough, altered mental status or any concerns. No signs of systemic illness or infection. The patient is nontoxic-appearing on exam and vital signs are within normal limits.   I have reviewed the triage vital signs and the nursing notes. Pertinent labs &imaging results that were available during my care of the patient were reviewed by me and considered in my medical decision making (see chart for details).After history, exam, and medical workup I feel the patient has beenappropriately medically screened and is safe for discharge home. Pertinent diagnoses were discussed with the patient. Patient was given return precautions.    Rx / DC Orders ED Discharge Orders         Ordered    cephALEXin (KEFLEX) 500 MG capsule  4 times daily        10/05/20 2353    phenazopyridine (PYRIDIUM) 200 MG tablet  3  times daily PRN        10/05/20 2353           Torryn Hudspeth, MD 10/06/20 0004

## 2020-10-05 NOTE — ED Triage Notes (Signed)
Pt reports diagnosed with UTI on 9/29 at [facility].  Started on antibiotic and it was changed after culture came back as E-coli.  Pt states she came to ED on Friday for worsening symptoms. Reports bilateral flank pain, burning with urination, and dysuria today.

## 2020-10-06 DIAGNOSIS — N390 Urinary tract infection, site not specified: Secondary | ICD-10-CM | POA: Diagnosis not present

## 2020-10-06 MED ORDER — PHENAZOPYRIDINE HCL 200 MG PO TABS: 200.0000 mg | ORAL_TABLET | Freq: Three times a day (TID) | ORAL | 0 refills | Status: DC | PRN

## 2020-10-07 LAB — URINE CULTURE: Culture: 80000 — AB

## 2020-10-08 ENCOUNTER — Telehealth: Payer: Self-pay | Admitting: *Deleted

## 2020-10-08 NOTE — Telephone Encounter (Signed)
Post ED Visit - Positive Culture Follow-up  Culture report reviewed by antimicrobial stewardship pharmacist: [facility] Team []  Elenor Quinones, Pharm.D. []  Heide Guile, Pharm.D., BCPS AQ-ID []  Parks Neptune, Pharm.D., BCPS []  Alycia Rossetti, Pharm.D., BCPS []  [place], Pharm.D., BCPS, AAHIVP []  Legrand Como, Pharm.D., BCPS, AAHIVP []  Salome Arnt, PharmD, BCPS []  Johnnette Gourd, PharmD, BCPS []  Hughes Better, PharmD, BCPS []  Leeroy Cha, PharmD []  Laqueta Linden, PharmD, BCPS []  Albertina Parr, PharmD  [facility] Team []  Leodis Sias, PharmD []  Lindell Spar, PharmD []  Royetta Asal, PharmD []  Graylin Shiver, Rph []  Rema Fendt) Glennon Mac, PharmD []  Arlyn Dunning, PharmD []  Netta Cedars, PharmD []  Dia Sitter, PharmD []  Leone Haven, PharmD []  Gretta Arab, PharmD []  Theodis Shove, PharmD []  Peggyann Juba, PharmD []  Reuel Boom, PharmD   Positive urine culture Treated with Cephalexin , organism sensitive to the same and no further patient follow-up is required at this time. Dimple Nanas, PharmD  [place]cel[place], Glymph Talley 10/08/2020, 10:13 AM

## 2020-10-16 ENCOUNTER — Emergency Department (HOSPITAL_COMMUNITY)
Admission: EM | Admit: 2020-10-16 | Discharge: 2020-10-16 | Disposition: A | Discharge status: Home / Self Care | Payer: BC Managed Care – PPO | Attending: Emergency Medicine | Admitting: Emergency Medicine

## 2020-10-16 ENCOUNTER — Encounter (HOSPITAL_COMMUNITY): Payer: Self-pay | Admitting: *Deleted

## 2020-10-16 ENCOUNTER — Other Ambulatory Visit: Payer: Self-pay

## 2020-10-16 DIAGNOSIS — R519 Headache, unspecified: Secondary | ICD-10-CM | POA: Insufficient documentation

## 2020-10-16 DIAGNOSIS — N390 Urinary tract infection, site not specified: Secondary | ICD-10-CM | POA: Insufficient documentation

## 2020-10-16 DIAGNOSIS — Z91040 Latex allergy status: Secondary | ICD-10-CM | POA: Insufficient documentation

## 2020-10-16 DIAGNOSIS — R350 Frequency of micturition: Secondary | ICD-10-CM

## 2020-10-16 DIAGNOSIS — N10 Acute tubulo-interstitial nephritis: Secondary | ICD-10-CM | POA: Insufficient documentation

## 2020-10-16 DIAGNOSIS — J4520 Mild intermittent asthma, uncomplicated: Secondary | ICD-10-CM | POA: Diagnosis not present

## 2020-10-16 DIAGNOSIS — R300 Dysuria: Secondary | ICD-10-CM

## 2020-10-16 LAB — URINALYSIS, ROUTINE W REFLEX MICROSCOPIC
Bilirubin Urine: NEGATIVE
Glucose, UA: NEGATIVE mg/dL
Hgb urine dipstick: NEGATIVE
Ketones, ur: 5 mg/dL — AB
Nitrite: NEGATIVE
Protein, ur: NEGATIVE mg/dL
Specific Gravity, Urine: 1.021 (ref 1.005–1.030)
pH: 5 (ref 5.0–8.0)

## 2020-10-16 LAB — CBC WITH DIFFERENTIAL/PLATELET
Abs Immature Granulocytes: 0.01 10*3/uL (ref 0.00–0.07)
Basophils Absolute: 0.1 10*3/uL (ref 0.0–0.1)
Basophils Relative: 1 %
Eosinophils Absolute: 0.1 10*3/uL (ref 0.0–0.5)
Eosinophils Relative: 1 %
HCT: 38.4 % (ref 36.0–46.0)
Hemoglobin: 12.9 g/dL (ref 12.0–15.0)
Immature Granulocytes: 0 %
Lymphocytes Relative: 31 %
Lymphs Abs: 1.8 10*3/uL (ref 0.7–4.0)
MCH: 30.9 pg (ref 26.0–34.0)
MCHC: 33.6 g/dL (ref 30.0–36.0)
MCV: 92.1 fL (ref 80.0–100.0)
Monocytes Absolute: 0.3 10*3/uL (ref 0.1–1.0)
Monocytes Relative: 6 %
Neutro Abs: 3.4 10*3/uL (ref 1.7–7.7)
Neutrophils Relative %: 61 %
Platelets: 253 10*3/uL (ref 150–400)
RBC: 4.17 MIL/uL (ref 3.87–5.11)
RDW: 12.1 % (ref 11.5–15.5)
WBC: 5.6 10*3/uL (ref 4.0–10.5)
nRBC: 0 % (ref 0.0–0.2)

## 2020-10-16 LAB — COMPREHENSIVE METABOLIC PANEL
ALT: 18 U/L (ref 0–44)
AST: 20 U/L (ref 15–41)
Albumin: 4.5 g/dL (ref 3.5–5.0)
Alkaline Phosphatase: 49 U/L (ref 38–126)
Anion gap: 10 (ref 5–15)
BUN: 10 mg/dL (ref 6–20)
CO2: 23 mmol/L (ref 22–32)
Calcium: 9.4 mg/dL (ref 8.9–10.3)
Chloride: 105 mmol/L (ref 98–111)
Creatinine, Ser: 0.96 mg/dL (ref 0.44–1.00)
GFR, Estimated: >60 mL/min (ref >60)
Glucose, Bld: 88 mg/dL (ref 70–99)
Potassium: 3.5 mmol/L (ref 3.5–5.1)
Sodium: 138 mmol/L (ref 135–145)
Total Bilirubin: 0.8 mg/dL (ref 0.3–1.2)
Total Protein: 7.5 g/dL (ref 6.5–8.1)

## 2020-10-16 LAB — WET PREP, GENITAL
Clue Cells Wet Prep HPF POC: NONE SEEN
Sperm: NONE SEEN
Trich, Wet Prep: NONE SEEN
WBC, Wet Prep HPF POC: NONE SEEN
Yeast Wet Prep HPF POC: NONE SEEN

## 2020-10-16 LAB — LACTIC ACID, PLASMA: Lactic Acid, Venous: 1.3 mmol/L (ref 0.5–1.9)

## 2020-10-16 LAB — PROTIME-INR
INR: 1.1 (ref 0.8–1.2)
Prothrombin Time: 14 seconds (ref 11.4–15.2)

## 2020-10-16 LAB — I-STAT BETA HCG BLOOD, ED (MC, WL, AP ONLY): I-stat hCG, quantitative: <5 m[IU]/mL (ref <5)

## 2020-10-16 MED ORDER — ONDANSETRON HCL 4 MG PO TABS: 4.0000 mg | ORAL_TABLET | Freq: Every day | ORAL | 1 refills | Status: AC | PRN

## 2020-10-16 MED ORDER — IBUPROFEN 400 MG PO TABS
600.0000 mg | ORAL_TABLET | Freq: Once | ORAL | Status: AC
  Administered 2020-10-16: 600 mg via ORAL
  Filled 2020-10-16: qty 1

## 2020-10-16 MED ORDER — ONDANSETRON 4 MG PO TBDP
8.0000 mg | ORAL_TABLET | Freq: Once | ORAL | Status: AC
  Administered 2020-10-16: 8 mg via ORAL
  Filled 2020-10-16: qty 2

## 2020-10-16 NOTE — Discharge Instructions (Signed)
You were seen in the ED tonight for nausea and flank pain in setting of recent urinary tract infection. Your blood work so far has returned normal. We will call you if any of the cultures return positive and you need treatment. You may use zofran for nausea and vomiting, continue to use tylenol and ibuprofen for pain.   If you cannot keep fluids down for 24 hours, have a fever (temperature greater than 100.4*F), or severe pain that does not improve with ibuprofen or tylenol, please return for evaluation.

## 2020-10-16 NOTE — ED Provider Notes (Signed)
[facility] EMERGENCY DEPARTMENT Provider Note   CSN: [id number] Arrival date & time: 10/16/20  1919     History Chief Complaint  Patient presents with   Urinary Tract Infection    Sydney [name] is a 29 y.o. female.  29 year old woman presents with headache, nausea, vomiting, flank pain x 1 day.  She has had multiple episodes of pyelonephritis and urinary tract infections.  Most recently was diagnosed with a UTI with E. coli on 9/29.  She was initially treated with cipro until cultures revealed cipro resistance. She subsequently finished macrobid and keflex, last keflex dose yesterday. On 10/8 she had a normal renal US. She has been referred to urology, is awaiting an appointment. She was diagnosed with PID in 2015. So far all cultures have been sensitive to keflex. Patient reports some mild dysuria, urgency, frequency, and some mild, clear vaginal discharge, and left flank pain. She is sexually active with one partner, she has an IUD and intermittently uses condoms.         Past Medical History:  Diagnosis Date   Asthma    prn inhaler-mostly in spring   Burn of left elbow 10/18/2013   Cut 10/18/2013   right lower leg - no sutures   History of MRSA infection age 66   thigh   Iron deficiency anemia 09/03/2015   Migraine headache    PONV (postoperative nausea and vomiting)    Postpartum care following cesarean delivery (9/7) 09/03/2015   Salivary gland stone 09/2013   left   Seasonal allergies    nasal congestion and sore throat 10/18/2013   TMJ tenderness    with opening mouth wide    Patient Active Problem List   Diagnosis Date Noted   Encounter for IUD insertion 03/20/2019   Mild intermittent asthma without complication    Sepsis secondary to UTI ([place]) 03/07/2019   Depression with anxiety 03/07/2019   Frequent urination 60/73/7106   Complicated UTI (urinary tract infection) 12/01/2018   Supervision of normal pregnancy,  antepartum 11/02/2016   Hx of preeclampsia, prior pregnancy, currently pregnant 11/02/2016   Previous cesarean section 11/02/2016    Past Surgical History:  Procedure Laterality Date   CESAREAN SECTION N/A 09/03/2015   Procedure: CESAREAN SECTION;  Surgeon: Brien Few, MD;  Location: [facility];  Service: Obstetrics;  Laterality: N/A;   DERMOID CYST  EXCISION Left 04/25/12   abd. wall   LIPOMA EXCISION  07/2013   back   SUBLINGUAL SALIVARY CYST EXCISION Left 10/24/2013   Procedure: REMOVAL OF SALIVARY GLAND STONE LEFT SIDE;  Surgeon: Ceasar Mons, DDS;  Location: [facility];  Service: Oral Surgery;  Laterality: Left;   TONSILLECTOMY AND ADENOIDECTOMY     WISDOM TOOTH EXTRACTION  10/2011     OB History    Gravida  2   Para  2   Term  2   Preterm      AB      Living  2     SAB      TAB      Ectopic      Multiple  0   Live Births  2           Family History  Problem Relation Age of Onset   Depression Mother    Anxiety disorder Mother    Vision loss Father    Vision loss Paternal Aunt    Cancer Maternal Grandmother    Depression Maternal Grandmother  Diabetes Maternal Grandmother    Hyperlipidemia Maternal Grandmother    Hypertension Maternal Grandmother    Miscarriages / Stillbirths Maternal Grandmother    Cancer Maternal Grandfather    Alcohol abuse Paternal Grandmother    Depression Paternal Grandmother    Alcohol abuse Paternal Grandfather    Diabetes Paternal Grandfather    Drug abuse Brother        Marijuana    Anxiety disorder Brother    Mental illness Brother        extensive mental history   COPD Neg Hx    Arthritis Neg Hx    Asthma Neg Hx    Early death Neg Hx    Hearing loss Neg Hx    Heart disease Neg Hx    Kidney disease Neg Hx    Learning disabilities Neg Hx    Mental retardation Neg Hx    Stroke Neg Hx    Varicose Veins Neg Hx     Social History   Tobacco Use    Smoking status: Never Smoker   Smokeless tobacco: Never Used  Vaping Use   Vaping Use: Never used  Substance Use Topics   Alcohol use: No   Drug use: No    Home Medications Prior to Admission medications   Medication Sig Start Date End Date Taking? Authorizing Provider  amphetamine-dextroamphetamine (ADDERALL) 20 MG tablet Take 20 mg by mouth 2 (two) times daily.   Yes [provider]  Levonorgestrel (LILETTA, 52 MG, IU) 1 Device by Intrauterine route continuous.   Yes [provider]  cephALEXin (KEFLEX) 500 MG capsule Take 1 capsule (500 mg total) by mouth 4 (four) times daily. Patient not taking: Reported on 10/16/2020 10/05/20   Palumbo, April, MD  phenazopyridine (PYRIDIUM) 200 MG tablet Take 1 tablet (200 mg total) by mouth 3 (three) times daily as needed for pain. Patient not taking: Reported on 10/16/2020 10/06/20   Palumbo, April, MD    Allergies    Apple, Marcelline Mates, Peanut butter flavor [flavoring agent], Fruit extracts, Latex, and Black walnut pollen allergy skin test  Review of Systems   Review of Systems  Constitutional: Negative for fever.  HENT: Negative for rhinorrhea and sore throat.   Respiratory: Negative for cough.   Gastrointestinal: Positive for nausea and vomiting. Negative for abdominal pain and diarrhea.  Genitourinary: Positive for dysuria, flank pain, frequency and vaginal discharge.  Neurological: Positive for headaches.  All other systems reviewed and are negative.   Physical Exam Updated Vital Signs BP 128/90 (BP Location: Right Arm)    Pulse 94    Temp 98.8 F (37.1 C) (Oral)    Resp 16    Ht 5\' 7"  (1.702 m)    Wt 72.6 kg    SpO2 100%    BMI 25.06 kg/m   Physical Exam Vitals and nursing note reviewed.  Constitutional:      General: She is not in acute distress.    Appearance: Normal appearance. She is normal weight. She is not ill-appearing, toxic-appearing or diaphoretic.  HENT:     Mouth/Throat:     Mouth: Mucous  membranes are moist.     Pharynx: Oropharynx is clear. No oropharyngeal exudate or posterior oropharyngeal erythema.  Eyes:     Conjunctiva/sclera: Conjunctivae normal.     Pupils: Pupils are equal, round, and reactive to light.  Cardiovascular:     Rate and Rhythm: Normal rate and regular rhythm.     Pulses: Normal pulses.     Heart sounds:  Normal heart sounds.  Pulmonary:     Effort: Pulmonary effort is normal.  Abdominal:     General: Abdomen is flat. There is no distension.     Palpations: Abdomen is soft.     Tenderness: There is abdominal tenderness. There is left CVA tenderness. There is no right CVA tenderness, guarding or rebound.     Comments: Suprapubic tenderness  Genitourinary:    Comments: PELVIC:  Normal appearing external female genitalia, normal vaginal epithelium, no abnormal discharge on visiual inspection. Bimanual: mild right adnexal tenderness, no CMT. No palpable adnexal masses.   Skin:    General: Skin is warm and dry.  Neurological:     General: No focal deficit present.     Mental Status: She is alert. Mental status is at baseline.  Psychiatric:        Mood and Affect: Mood normal.        Behavior: Behavior normal.     ED Results / Procedures / Treatments   Labs (all labs ordered are listed, but only abnormal results are displayed) Labs Reviewed  URINALYSIS, ROUTINE W REFLEX MICROSCOPIC - Abnormal; Notable for the following components:      Result Value   APPearance HAZY (*)    Ketones, ur 5 (*)    Leukocytes,Ua TRACE (*)    Bacteria, UA RARE (*)    All other components within normal limits  CULTURE, BLOOD (ROUTINE X 2)  CULTURE, BLOOD (ROUTINE X 2)  URINE CULTURE  WET PREP, GENITAL  COMPREHENSIVE METABOLIC PANEL  LACTIC ACID, PLASMA  CBC WITH DIFFERENTIAL/PLATELET  PROTIME-INR  LACTIC ACID, PLASMA  I-STAT BETA HCG BLOOD, ED ([facility], [facility], [facility] ONLY)  CERVICOVAGINAL ANCILLARY ONLY    EKG None  Radiology No results  found.  Procedures Procedures (including critical care time)  Medications Ordered in ED Medications  ondansetron (ZOFRAN-ODT) disintegrating tablet 8 mg (8 mg Oral Given 10/16/20 2247)  ibuprofen (ADVIL) tablet 600 mg (600 mg Oral Given 10/16/20 2246)    ED Course  I have reviewed the triage vital signs and the nursing notes.  Pertinent labs & imaging results that were available during my care of the patient were reviewed by me and considered in my medical decision making (see chart for details).    MDM Rules/Calculators/A&P                          29 yo woman with multiple UTIs and pyelonephritis in the past presents today with urinary symptoms, flank pain, and head ache. UA with trace leukocytes, no nitrites, CMP and CBC WNL. VSS, afebrile. Blood and urine cultures obtained, gc/chlamydia to r/o PID and wet prep obtained. Treating headache with ibuprofen, nausea with zofran. If passes PO challenge, can go home with close follow up with PCP and cultures.  Patient feeling better with ibuprofen and zofran, will discharge home with zofran and close follow up with PCP. ED will follow up with patient if any cultures return positive requiring treatment. VSS, afebrile, safe for discharge. Discussed return precautions. Patient is in agreement with plan.   Final Clinical Impression(s) / ED Diagnoses Final diagnoses:  Lower urinary tract infectious disease  Dysuria  Urinary frequency    Rx / DC Orders ED Discharge Orders    None       Gladys Damme, MD 10/16/20 2345    Lajean Saver, MD 10/17/20 2053

## 2020-10-16 NOTE — ED Triage Notes (Signed)
Pt has had a UTI since 9/29 and states that she completed 3 rounds of antibiotics without getting better.  Pt took cipro, then macrobid and then keflex.  Keflex ended yesterday.  Pt states that this is an e-coli UTI and she is feeling "terrible"

## 2020-10-17 LAB — CERVICOVAGINAL ANCILLARY ONLY
Chlamydia: NEGATIVE
Comment: NEGATIVE
Comment: NORMAL
Neisseria Gonorrhea: NEGATIVE

## 2020-10-18 LAB — URINE CULTURE: Culture: NO GROWTH

## 2020-10-21 LAB — CULTURE, BLOOD (ROUTINE X 2)
Culture: NO GROWTH
Culture: NO GROWTH
Special Requests: ADEQUATE

## 2021-05-12 ENCOUNTER — Emergency Department (HOSPITAL_COMMUNITY): Payer: BC Managed Care – PPO

## 2021-05-12 ENCOUNTER — Other Ambulatory Visit: Payer: Self-pay

## 2021-05-12 ENCOUNTER — Encounter (HOSPITAL_COMMUNITY): Payer: Self-pay | Admitting: Emergency Medicine

## 2021-05-12 ENCOUNTER — Emergency Department (HOSPITAL_COMMUNITY)
Admission: EM | Admit: 2021-05-12 | Discharge: 2021-05-13 | Discharge status: Against Medical Advice | Payer: BC Managed Care – PPO | Attending: Emergency Medicine | Admitting: Emergency Medicine

## 2021-05-12 DIAGNOSIS — M791 Myalgia, unspecified site: Secondary | ICD-10-CM | POA: Insufficient documentation

## 2021-05-12 DIAGNOSIS — G43909 Migraine, unspecified, not intractable, without status migrainosus: Secondary | ICD-10-CM | POA: Insufficient documentation

## 2021-05-12 DIAGNOSIS — Z5321 Procedure and treatment not carried out due to patient leaving prior to being seen by health care provider: Secondary | ICD-10-CM | POA: Insufficient documentation

## 2021-05-12 DIAGNOSIS — R509 Fever, unspecified: Secondary | ICD-10-CM | POA: Diagnosis not present

## 2021-05-12 DIAGNOSIS — R0602 Shortness of breath: Secondary | ICD-10-CM | POA: Insufficient documentation

## 2021-05-12 DIAGNOSIS — Z20822 Contact with and (suspected) exposure to covid-19: Secondary | ICD-10-CM | POA: Insufficient documentation

## 2021-05-12 DIAGNOSIS — R059 Cough, unspecified: Secondary | ICD-10-CM | POA: Diagnosis present

## 2021-05-12 LAB — CBC WITH DIFFERENTIAL/PLATELET
Abs Immature Granulocytes: 0.01 10*3/uL (ref 0.00–0.07)
Basophils Absolute: 0.1 10*3/uL (ref 0.0–0.1)
Basophils Relative: 2 %
Eosinophils Absolute: 0.1 10*3/uL (ref 0.0–0.5)
Eosinophils Relative: 2 %
HCT: 36.4 % (ref 36.0–46.0)
Hemoglobin: 12.3 g/dL (ref 12.0–15.0)
Immature Granulocytes: 0 %
Lymphocytes Relative: 27 %
Lymphs Abs: 0.8 10*3/uL (ref 0.7–4.0)
MCH: 31.4 pg (ref 26.0–34.0)
MCHC: 33.8 g/dL (ref 30.0–36.0)
MCV: 92.9 fL (ref 80.0–100.0)
Monocytes Absolute: 0.4 10*3/uL (ref 0.1–1.0)
Monocytes Relative: 14 %
Neutro Abs: 1.6 10*3/uL — ABNORMAL LOW (ref 1.7–7.7)
Neutrophils Relative %: 55 %
Platelets: 183 10*3/uL (ref 150–400)
RBC: 3.92 MIL/uL (ref 3.87–5.11)
RDW: 12 % (ref 11.5–15.5)
WBC: 2.9 10*3/uL — ABNORMAL LOW (ref 4.0–10.5)
nRBC: 0 % (ref 0.0–0.2)

## 2021-05-12 LAB — COMPREHENSIVE METABOLIC PANEL
ALT: 16 U/L (ref 0–44)
AST: 19 U/L (ref 15–41)
Albumin: 4 g/dL (ref 3.5–5.0)
Alkaline Phosphatase: 42 U/L (ref 38–126)
Anion gap: 5 (ref 5–15)
BUN: 7 mg/dL (ref 6–20)
CO2: 26 mmol/L (ref 22–32)
Calcium: 8.7 mg/dL — ABNORMAL LOW (ref 8.9–10.3)
Chloride: 106 mmol/L (ref 98–111)
Creatinine, Ser: 0.88 mg/dL (ref 0.44–1.00)
GFR, Estimated: >60 mL/min (ref >60)
Glucose, Bld: 90 mg/dL (ref 70–99)
Potassium: 4.2 mmol/L (ref 3.5–5.1)
Sodium: 137 mmol/L (ref 135–145)
Total Bilirubin: 0.5 mg/dL (ref 0.3–1.2)
Total Protein: 6.8 g/dL (ref 6.5–8.1)

## 2021-05-12 LAB — RESP PANEL BY RT-PCR (FLU A&B, COVID) ARPGX2
Influenza A by PCR: POSITIVE — AB
Influenza B by PCR: NEGATIVE
SARS Coronavirus 2 by RT PCR: NEGATIVE

## 2021-05-12 MED ORDER — IBUPROFEN 800 MG PO TABS
800.0000 mg | ORAL_TABLET | Freq: Once | ORAL | Status: AC
  Administered 2021-05-12: 800 mg via ORAL
  Filled 2021-05-12: qty 2

## 2021-05-12 MED ORDER — ONDANSETRON 4 MG PO TBDP
4.0000 mg | ORAL_TABLET | Freq: Once | ORAL | Status: AC
  Administered 2021-05-12: 4 mg via ORAL
  Filled 2021-05-12: qty 1

## 2021-05-12 NOTE — ED Provider Notes (Signed)
Emergency Medicine Provider Triage Evaluation Note  Sydney [name] , a 30 y.o. female  was evaluated in triage.  Pt complains of fever, headache, nausea, cough, shortness of breath.  Patient states she has had very bad allergies this season.  For the past couple days, cough changed became more frequent.  She reports headache, body aches, fever today.  She has nausea and is coughing so hard she vomits.  She is not vaccinated for COVID or flu.  No sick contacts.  Besides asthma, no significant medical problems.  Review of Systems  Positive: Fever, HA, nausea, cough Negative: abd pain  Physical Exam  BP 117/85 (BP Location: Right Arm)   Pulse (!) 106   Temp 98.9 F (37.2 C) (Oral)   Resp 16   Ht 5\' 7"  (1.702 m)   Wt 65.8 kg   SpO2 100%   BMI 22.71 kg/m  Gen:   Awake, no distress   Resp:  Normal effort, frequent cough noted.  Coarse lung sounds.  Sats stable on room air.  Speaking in full sentences. MSK:   Moves extremities without difficulty  Other:  No gross neurologic deficits.  CN intact.  Grip strength equal bilaterally.  Medical Decision Making  Medically screening exam initiated at 8:51 PM.  Appropriate orders placed.  [name] was informed that the remainder of the evaluation will be completed by another provider, this initial triage assessment does not replace that evaluation, and the importance of remaining in the ED until their evaluation is complete.  Labs, cxr, and covid/flu ordered   Franchot Heidelberg, PA-C 05/12/21 2052    Elnora Morrison, MD 05/13/21 [phone]

## 2021-05-12 NOTE — ED Triage Notes (Signed)
Pt reports migraines, nausea, productive cough, generalized body aches, chills and fever that started getting worse today that has been going on for a while. Pt thought it was allergies and was using her home inhaler multiple times a day with minimal relief. Pt took Tylenol at home prior to arrival with a temp of 100.71F. Pt took a home covid test which was negative.

## 2021-05-13 NOTE — ED Notes (Signed)
Patient left on own accord °

## 2021-05-13 NOTE — ED Notes (Signed)
Vitals called x6 

## 2022-06-13 ENCOUNTER — Emergency Department (HOSPITAL_COMMUNITY): Payer: Managed Care, Other (non HMO)

## 2022-06-13 ENCOUNTER — Encounter (HOSPITAL_COMMUNITY): Payer: Self-pay | Admitting: Emergency Medicine

## 2022-06-13 ENCOUNTER — Other Ambulatory Visit: Payer: Self-pay

## 2022-06-13 ENCOUNTER — Emergency Department (HOSPITAL_COMMUNITY)
Admission: EM | Admit: 2022-06-13 | Discharge: 2022-06-14 | Disposition: A | Discharge status: Home / Self Care | Payer: Managed Care, Other (non HMO) | Attending: Emergency Medicine | Admitting: Emergency Medicine

## 2022-06-13 DIAGNOSIS — M542 Cervicalgia: Secondary | ICD-10-CM | POA: Insufficient documentation

## 2022-06-13 DIAGNOSIS — Z91040 Latex allergy status: Secondary | ICD-10-CM | POA: Diagnosis not present

## 2022-06-13 DIAGNOSIS — Z91010 Allergy to peanuts: Secondary | ICD-10-CM | POA: Diagnosis not present

## 2022-06-13 DIAGNOSIS — K115 Sialolithiasis: Secondary | ICD-10-CM

## 2022-06-13 LAB — COMPREHENSIVE METABOLIC PANEL
ALT: 14 U/L (ref 0–44)
AST: 16 U/L (ref 15–41)
Albumin: 4 g/dL (ref 3.5–5.0)
Alkaline Phosphatase: 38 U/L (ref 38–126)
Anion gap: 6 (ref 5–15)
BUN: 9 mg/dL (ref 6–20)
CO2: 25 mmol/L (ref 22–32)
Calcium: 8.8 mg/dL — ABNORMAL LOW (ref 8.9–10.3)
Chloride: 107 mmol/L (ref 98–111)
Creatinine, Ser: 0.76 mg/dL (ref 0.44–1.00)
GFR, Estimated: >60 mL/min (ref >60)
Glucose, Bld: 96 mg/dL (ref 70–99)
Potassium: 3.8 mmol/L (ref 3.5–5.1)
Sodium: 138 mmol/L (ref 135–145)
Total Bilirubin: 0.4 mg/dL (ref 0.3–1.2)
Total Protein: 7 g/dL (ref 6.5–8.1)

## 2022-06-13 LAB — CBC WITH DIFFERENTIAL/PLATELET
Abs Immature Granulocytes: 0.02 10*3/uL (ref 0.00–0.07)
Basophils Absolute: 0 10*3/uL (ref 0.0–0.1)
Basophils Relative: 0 %
Eosinophils Absolute: 0.1 10*3/uL (ref 0.0–0.5)
Eosinophils Relative: 1 %
HCT: 36.1 % (ref 36.0–46.0)
Hemoglobin: 12.2 g/dL (ref 12.0–15.0)
Immature Granulocytes: 0 %
Lymphocytes Relative: 21 %
Lymphs Abs: 1.5 10*3/uL (ref 0.7–4.0)
MCH: 31.6 pg (ref 26.0–34.0)
MCHC: 33.8 g/dL (ref 30.0–36.0)
MCV: 93.5 fL (ref 80.0–100.0)
Monocytes Absolute: 0.5 10*3/uL (ref 0.1–1.0)
Monocytes Relative: 7 %
Neutro Abs: 4.9 10*3/uL (ref 1.7–7.7)
Neutrophils Relative %: 71 %
Platelets: 212 10*3/uL (ref 150–400)
RBC: 3.86 MIL/uL — ABNORMAL LOW (ref 3.87–5.11)
RDW: 11.6 % (ref 11.5–15.5)
WBC: 7.1 10*3/uL (ref 4.0–10.5)
nRBC: 0 % (ref 0.0–0.2)

## 2022-06-13 LAB — HCG, QUANTITATIVE, PREGNANCY: hCG, Beta Chain, Quant, S: 1 m[IU]/mL (ref <5)

## 2022-06-13 MED ORDER — IOHEXOL 300 MG/ML  SOLN
100.0000 mL | Freq: Once | INTRAMUSCULAR | Status: AC | PRN
  Administered 2022-06-13: 100 mL via INTRAVENOUS

## 2022-06-13 MED ORDER — MORPHINE SULFATE (PF) 4 MG/ML IV SOLN
4.0000 mg | Freq: Once | INTRAVENOUS | Status: AC
  Administered 2022-06-13: 4 mg via INTRAVENOUS
  Filled 2022-06-13: qty 1

## 2022-06-13 MED ORDER — SODIUM CHLORIDE 0.9 % IV BOLUS
500.0000 mL | Freq: Once | INTRAVENOUS | Status: AC
  Administered 2022-06-13: 500 mL via INTRAVENOUS

## 2022-06-13 NOTE — ED Provider Triage Note (Signed)
Emergency Medicine Provider Triage Evaluation Note  Sydney [name] , a 31 y.o. female  was evaluated in triage.  Pt complains of left-sided neck swelling.  Patient has a history of left submandibular stone with spontaneous passage and surgical intervention.  She states that this episode of swelling has got progressively worse over the past week.  She reports difficulty swallowing secondary to pain and swelling.  She states that the back left of her tongue feels swollen.  Denies shortness of breath or difficulty breathing.  Denies fever, chills, sweats, chest pain, abdominal pain, nausea/vomiting/diarrhea.  Review of Systems  Positive:  Negative: See above  Physical Exam  BP 119/75 (BP Location: Right Arm)   Pulse 78   Temp 98.6 F (37 C) (Oral)   Resp 16   Ht '5\' 7"'$  (1.702 m)   Wt 68 kg   SpO2 100%   BMI 23.49 kg/m  Gen:   Awake, no distress   Resp:  Normal effort  MSK:   Moves extremities without difficulty  Other:  Mass in the left submandibular.  The area is firm and tender to palpation.  No overlying skin abnormalities noted.  Medical Decision Making  Medically screening exam initiated at 3:41 PM.  Appropriate orders placed.  [name] was informed that the remainder of the evaluation will be completed by another provider, this initial triage assessment does not replace that evaluation, and the importance of remaining in the ED until their evaluation is complete.    Wilnette Kales, [place] 06/13/22 1545

## 2022-06-13 NOTE — ED Provider Notes (Signed)
[facility] EMERGENCY DEPARTMENT Provider Note   CSN: [id number] Arrival date & time: 06/13/22  1459     History  Chief Complaint  Patient presents with   Oral Swelling    Sydney [name] is a 31 y.o. female.  HPI  31 year old female presents emergency department with left-sided neck swelling.  History of sialolithiasis, she has had stone removal on the right remotely back in 2014.  At that time she had a known left submandibular stone but the plan was to treated conservatively.  However this past week the stone became more symptomatic, not relieved with over-the-counter treatments.  She is now having left-sided neck pain when swallowing as well as left-sided sinus pain.  Denies any fever or other oral/facial swelling.  Currently does not have any outpatient ENT follow-up.  Home Medications Prior to Admission medications   Medication Sig Start Date End Date Taking? Authorizing Provider  amphetamine-dextroamphetamine (ADDERALL) 20 MG tablet Take 20 mg by mouth 2 (two) times daily.    [provider]  cephALEXin (KEFLEX) 500 MG capsule Take 1 capsule (500 mg total) by mouth 4 (four) times daily. Patient not taking: Reported on 10/16/2020 10/05/20   Palumbo, April, MD  Levonorgestrel (LILETTA, 52 MG, IU) 1 Device by Intrauterine route continuous.    [provider]  phenazopyridine (PYRIDIUM) 200 MG tablet Take 1 tablet (200 mg total) by mouth 3 (three) times daily as needed for pain. Patient not taking: Reported on 10/16/2020 10/06/20   Palumbo, April, MD      Allergies    Apple juice, Cherry, Peanut butter flavor [flavoring agent], Fruit extracts, Latex, and Black walnut pollen allergy skin test    Review of Systems   Review of Systems  Constitutional:  Negative for fever.  HENT:  Negative for facial swelling.   Gastrointestinal:  Positive for nausea.  Skin:  Negative for rash.  Neurological:  Negative for headaches.    Physical  Exam Updated Vital Signs BP 114/74   Pulse 74   Temp 98.6 F (37 C) (Oral)   Resp 16   Ht '5\' 7"'$  (1.702 m)   Wt 68 kg   SpO2 100%   BMI 23.49 kg/m  Physical Exam Vitals and nursing note reviewed.  Constitutional:      General: She is not in acute distress.    Appearance: Normal appearance.  HENT:     Head: Normocephalic.     Mouth/Throat:     Mouth: Mucous membranes are moist.     Pharynx: No oropharyngeal exudate or posterior oropharyngeal erythema.  Neck:     Comments: Mild left submandibular edema, palpable swelling/nodule in the left submandibular.  No stridor, no other neck swelling Cardiovascular:     Rate and Rhythm: Normal rate.  Pulmonary:     Effort: Pulmonary effort is normal.  Skin:    General: Skin is warm.  Neurological:     Mental Status: She is alert and oriented to person, place, and time. Mental status is at baseline.  Psychiatric:        Mood and Affect: Mood normal.     ED Results / Procedures / Treatments   Labs (all labs ordered are listed, but only abnormal results are displayed) Labs Reviewed  COMPREHENSIVE METABOLIC PANEL - Abnormal; Notable for the following components:      Result Value   Calcium 8.8 (*)    All other components within normal limits  CBC WITH DIFFERENTIAL/PLATELET - Abnormal; Notable for the following  components:   RBC 3.86 (*)    All other components within normal limits  HCG, QUANTITATIVE, PREGNANCY    EKG None  Radiology CT Soft Tissue Neck W Contrast  Result Date: 06/13/2022 CLINICAL DATA:  Soft tissue swelling. Infection suspected. Personal history of salivary gland stones. Left-sided neck swelling. EXAM: CT NECK WITH CONTRAST TECHNIQUE: Multidetector CT imaging of the neck was performed using the standard protocol following the bolus administration of intravenous contrast. RADIATION DOSE REDUCTION: This exam was performed according to the departmental dose-optimization program which includes automated exposure  control, adjustment of the mA and/or kV according to patient size and/or use of iterative reconstruction technique. CONTRAST:  119m OMNIPAQUE IOHEXOL 300 MG/ML  SOLN COMPARISON:  CT neck 01/06/2010 FINDINGS: Pharynx and larynx: No focal mucosal or submucosal lesions are present. The nasopharynx and soft palate are within normal limits. Tongue base and palatine tonsils are within normal limits. The parapharyngeal fat is clear. Vallecula and epiglottis are within normal limits. Aryepiglottic folds and piriform sinuses are clear. Vocal cords are midline and symmetric. Trachea is clear. Salivary glands: Calcification in the superomedial aspect of the left submandibular gland measures 9 x 7 x 7 mm. There is some edema around the stone. No significant intra glandular dilation is present. The distal duct is within normal limits. The right submandibular gland and duct is normal. Parotid glands and ducts are within normal limits bilaterally. Thyroid: Normal thyroid Lymph nodes: 2 left level 2 lymph nodes measure 15 x 11 mm and 17 x 11 mm respectively. Smaller left submandibular nodes are present. No significant right-sided adenopathy is present. No necrotic nodes are present. Vascular: No significant vascular lesions are present. Limited intracranial: Within normal limits. Visualized orbits: The globes and orbits are within normal limits. Mastoids and visualized paranasal sinuses: Fluid level is present in the left maxillary sinus. Asymmetric mucosal disease is present in the left ethmoid air cells. The paranasal sinuses and mastoid air cells are otherwise clear. Skeleton: Vertebral body heights and alignment are normal. No focal osseous lesions are present. Upper chest: Lung apices are clear. Thoracic inlet is within normal limits. IMPRESSION: 1. 9 x 7 x 7 mm calcification in the superomedial aspect of the left submandibular gland with some edema around the stone. No significant intra glandular dilation is present. 2.  Enlarged left level 2 and submandibular lymph nodes are likely reactive. 3. Acute left maxillary sinusitis. Electronically Signed   By: CSan MorelleM.D.   On: 06/13/2022 17:36    Procedures Procedures    Medications Ordered in ED Medications  iohexol (OMNIPAQUE) 300 MG/ML solution 100 mL (100 mLs Intravenous Contrast Given 06/13/22 1656)  morphine (PF) 4 MG/ML injection 4 mg (4 mg Intravenous Given 06/13/22 2249)  sodium chloride 0.9 % bolus 500 mL (500 mLs Intravenous New Bag/Given 06/13/22 2249)    ED Course/ Medical Decision Making/ A&P                           Medical Decision Making Risk Prescription drug management.   31year old female presents emergency department with left submandibular pain/swelling, presumably from salivary gland stone of which she has had before.  Currently no outpatient ENT follow-up.  She is complaining of left sinus pain, left jaw pain.  She has painful swallowing but no difficulty swallowing/drooling.  Vitals are stable on arrival.  Blood work is reassuring.  CT confirms a large left submandibular stone with surrounding edema and left-sided maxillary sinusitis  but no other focal infection/abscess.  Patient given symptomatic pain control here in the department.  Consulted with on-call ENT, Dr. Constance Holster , he recommends antibiotics, pain control and outpatient ENT follow-up.  Patient at this time appears safe and stable for discharge and close outpatient follow up. Discharge plan and strict return to ED precautions discussed, patient verbalizes understanding and agreement.        Final Clinical Impression(s) / ED Diagnoses Final diagnoses:  None    Rx / DC Orders ED Discharge Orders     None         Lorelle Gibbs, DO 06/13/22 2355

## 2022-06-13 NOTE — ED Triage Notes (Signed)
Pt reports hx of salivary gland stone. Pt reports pain has increased over the past week with pain spreading down neck and swelling, attempted otc medications with no relief. Also headache, difficulty swallowing with shob.

## 2022-06-14 DIAGNOSIS — M542 Cervicalgia: Secondary | ICD-10-CM | POA: Diagnosis not present

## 2022-06-14 MED ORDER — AMOXICILLIN-POT CLAVULANATE 875-125 MG PO TABS: 1.0000 | ORAL_TABLET | Freq: Two times a day (BID) | ORAL | 0 refills | Status: DC

## 2022-06-14 MED ORDER — HYDROCODONE-ACETAMINOPHEN 5-325 MG PO TABS: 1.0000 | ORAL_TABLET | Freq: Four times a day (QID) | ORAL | 0 refills | Status: DC | PRN

## 2022-06-14 MED ORDER — HYDROCODONE-ACETAMINOPHEN 5-325 MG PO TABS
1.0000 | ORAL_TABLET | Freq: Once | ORAL | Status: AC
  Administered 2022-06-14: 1 via ORAL
  Filled 2022-06-14: qty 1

## 2022-06-14 MED ORDER — AMOXICILLIN-POT CLAVULANATE 875-125 MG PO TABS
1.0000 | ORAL_TABLET | Freq: Once | ORAL | Status: AC
  Administered 2022-06-14: 1 via ORAL
  Filled 2022-06-14: qty 1

## 2022-06-14 NOTE — Discharge Instructions (Signed)
You have been seen and discharged from the emergency department.  Follow-up with ENT, Dr. Constance Holster, for further evaluation and further care. Take antibiotic and pain medicine as directed.  Do not mix this medication with alcohol or other sedating medications. Do not drive or do heavy physical activity until you know how this medication affects you.  It may cause drowsiness.   If you have any worsening symptoms, high fevers, inability to swallow or further concerns for your health please return to an emergency department for further evaluation.

## 2022-07-18 NOTE — H&P (Signed)
HPI:   Sydney [name] is a 31 y.o. female who presents as a new Patient.   Referring Provider: Self, A Referral  Chief complaint: Submandibular stone.  HPI: 44 emergency department recently and was found to have a small submandibular stone in the left gland. She had a stone removed about 9 years ago by an Chief Financial Officer. This was felt to be too far posterior and the oral surgeon did not feel comfortable with this. She saw another ENT in the past who recommended either observation or removal of the stone. She was not having a lot of trouble with it until recently when she started having painful swelling with eating. Otherwise in good health.  PMH/Meds/All/SocHx/FamHx/ROS:   History reviewed. No pertinent past medical history.  Past Surgical History:  Procedure Laterality Date   intraoral stone removal Left 2014   SIALODOCHOPLASTY Left 2014   No family history of bleeding disorders, wound healing problems or difficulty with anesthesia.   Social History   Socioeconomic History   Marital status: Not on file  Spouse name: Not on file   Number of children: Not on file   Years of education: Not on file   Highest education level: Not on file  Occupational History   Not on file  Tobacco Use   Smoking status: Never   Smokeless tobacco: Never  Vaping Use   Vaping Use: Every day   Substances: Nicotine  Substance and Sexual Activity   Alcohol use: Not Currently   Drug use: Not on file   Sexual activity: Not on file  Other Topics Concern   Not on file  Social History Narrative   Not on file   Social Determinants of Health   Financial Resource Strain: Not on file  Food Insecurity: Not on file  Transportation Needs: Not on file  Physical Activity: Not on file  Stress: Not on file  Social Connections: Not on file  Housing Stability: Not on file   Current Outpatient Medications:   amoxicillin (AMOXIL) 875 MG tablet, Take 1 tablet (875 mg total) by mouth 2 times daily for 10  days., Disp: 20 tablet, Rfl: 0  dextroamphetamine-amphetamine (ADDERALL) 20 mg tablet, dextroamphetamine-amphetamine 20 mg tablet, Disp: , Rfl:   sulfamethoxazole-trimethoprim (BACTRIM DS) 800-160 mg per tablet, sulfamethoxazole 800 mg-trimethoprim 160 mg tablet, Disp: , Rfl:   A complete ROS was performed with pertinent positives/negatives noted in the HPI. The remainder of the ROS are negative.   Physical Exam:   Temp 97.9 F (36.6 C)  Ht 1.702 m ('5\' 7"'$ )  Wt 68 kg (150 lb)  BMI 23.49 kg/m   General: Healthy and alert, in no distress, breathing easily. Normal affect. In a pleasant mood. Head: Normocephalic, atraumatic. No masses, or scars. Eyes: Pupils are equal, and reactive to light. Vision is grossly intact. No spontaneous or gaze nystagmus. Ears: Ear canals are clear. Tympanic membranes are intact, with normal landmarks and the middle ears are clear and healthy. Hearing: Grossly normal. Nose: Nasal cavities are clear with healthy mucosa, no polyps or exudate. Airways are patent. Face: No masses or scars, facial nerve function is symmetric. Oral Cavity: No mucosal abnormalities are noted. Tongue with normal mobility. Dentition appears healthy. Palpation of the floor of mouth reveals some tenderness but no mass identified. Oropharynx: Tonsils are symmetric. There are no mucosal masses identified. Tongue base appears normal and healthy. Larynx/Hypopharynx: deferred Chest: Deferred Neck: Mildly tender induration and enlargement of the left submandibular gland. No other palpable masses, no cervical adenopathy,  no thyroid nodules or enlargement. Neuro: Cranial nerves II-XII with normal function. Balance: Normal gate. Other findings: none.  Independent Review of Additional Tests or Records:  CT neck:  IMPRESSION:  1. 9 x 7 x 7 mm calcification in the superomedial aspect of the left  submandibular gland with some edema around the stone. No significant  intra glandular dilation is  present.  2. Enlarged left level 2 and submandibular lymph nodes are likely  reactive.  3. Acute left maxillary sinusitis.   Procedures:  none  Impression & Plans:  Left submandibular stone. Recommend surgical removal of this. Given her history I would actually recommend removal of the gland. I think that would be the best thing for her in the long run. Risks and benefits were discussed. We will schedule at her convenience.

## 2022-07-19 ENCOUNTER — Encounter (HOSPITAL_COMMUNITY): Payer: Self-pay | Admitting: Otolaryngology

## 2022-07-20 ENCOUNTER — Encounter (HOSPITAL_COMMUNITY): Payer: Self-pay | Admitting: Otolaryngology

## 2022-07-20 NOTE — Progress Notes (Signed)
PCP - Bridget Hartshorn, NP Cardiologist -  Ruoff, Alean Rinne, PA-C   EKG - DOS Chest x-ray - 05/12/21 ECHO -  Cardiac Cath -  CPAP -   ERAS Protcol - clears 1000 COVID TEST- n/a  Anesthesia review: n/a  -------------  SDW INSTRUCTIONS:  Your procedure is scheduled on 7/26. Please report to [facility] Main Entrance "A" at 10:40 A.M., and check in at the Admitting office. Call this number if you have problems the morning of surgery: [phone]   Remember: Do not eat after midnight the night before your surgery  You may drink clear liquids until 1000 the morning of your surgery.   Clear liquids allowed are: Water, Non-Citrus Juices (without pulp), Carbonated Beverages, Clear Tea, Black Coffee Only, and Gatorade   Medications to take morning of surgery with a sip of water include: None Please bring all inhalers with you the day of surgery.    As of today, STOP taking any Aspirin (unless otherwise instructed by your surgeon), Aleve, Naproxen, Ibuprofen, Motrin, Advil, Goody's, BC's, all herbal medications, fish oil, and all vitamins.    The Morning of Surgery Do not wear jewelry, make-up or nail polish. Do not wear lotions, powders, or perfumes, or deodorant Do not bring valuables to the hospital. [facility] is not responsible for any belongings or valuables.  If you are a smoker, DO NOT Smoke 24 hours prior to surgery  If you wear a CPAP at night please bring your mask the morning of surgery   Remember that you must have someone to transport you home after your surgery, and remain with you for 24 hours if you are discharged the same day.  Please bring cases for contacts, glasses, hearing aids, dentures or bridgework because it cannot be worn into surgery.   Patients discharged the day of surgery will not be allowed to drive home.   Please shower the NIGHT BEFORE/MORNING OF SURGERY (use antibacterial soap like DIAL soap if possible). Wear comfortable clothes the  morning of surgery. Oral Hygiene is also important to reduce your risk of infection.  Remember - BRUSH YOUR TEETH THE MORNING OF SURGERY WITH YOUR REGULAR TOOTHPASTE  Patient denies shortness of breath, fever, cough and chest pain.

## 2022-07-21 ENCOUNTER — Ambulatory Visit (HOSPITAL_BASED_OUTPATIENT_CLINIC_OR_DEPARTMENT_OTHER): Payer: 59 | Admitting: Anesthesiology

## 2022-07-21 ENCOUNTER — Other Ambulatory Visit: Payer: Self-pay

## 2022-07-21 ENCOUNTER — Encounter (HOSPITAL_COMMUNITY)
Admission: RE | Disposition: A | Discharge status: Home / Self Care | Payer: Self-pay | Source: Ambulatory Visit | Attending: Otolaryngology

## 2022-07-21 ENCOUNTER — Encounter (HOSPITAL_COMMUNITY): Payer: Self-pay | Admitting: Otolaryngology

## 2022-07-21 ENCOUNTER — Ambulatory Visit (HOSPITAL_COMMUNITY): Payer: 59 | Admitting: Anesthesiology

## 2022-07-21 ENCOUNTER — Observation Stay (HOSPITAL_COMMUNITY)
Admission: RE | Admit: 2022-07-21 | Discharge: 2022-07-22 | Disposition: A | Discharge status: Home / Self Care | Payer: 59 | Source: Ambulatory Visit | Attending: Otolaryngology | Admitting: Otolaryngology

## 2022-07-21 DIAGNOSIS — K115 Sialolithiasis: Secondary | ICD-10-CM

## 2022-07-21 DIAGNOSIS — Z91040 Latex allergy status: Secondary | ICD-10-CM | POA: Diagnosis not present

## 2022-07-21 HISTORY — PX: SUBMANDIBULAR GLAND EXCISION: SHX2456

## 2022-07-21 LAB — CBC
HCT: 42.1 % (ref 36.0–46.0)
Hemoglobin: 14.6 g/dL (ref 12.0–15.0)
MCH: 31.9 pg (ref 26.0–34.0)
MCHC: 34.7 g/dL (ref 30.0–36.0)
MCV: 92.1 fL (ref 80.0–100.0)
Platelets: 199 10*3/uL (ref 150–400)
RBC: 4.57 MIL/uL (ref 3.87–5.11)
RDW: 11.9 % (ref 11.5–15.5)
WBC: 5.3 10*3/uL (ref 4.0–10.5)
nRBC: 0 % (ref 0.0–0.2)

## 2022-07-21 LAB — POCT PREGNANCY, URINE: Preg Test, Ur: NEGATIVE

## 2022-07-21 LAB — BASIC METABOLIC PANEL
Anion gap: 8 (ref 5–15)
BUN: 11 mg/dL (ref 6–20)
CO2: 23 mmol/L (ref 22–32)
Calcium: 8.9 mg/dL (ref 8.9–10.3)
Chloride: 107 mmol/L (ref 98–111)
Creatinine, Ser: 0.86 mg/dL (ref 0.44–1.00)
GFR, Estimated: >60 mL/min (ref >60)
Glucose, Bld: 78 mg/dL (ref 70–99)
Potassium: 3.5 mmol/L (ref 3.5–5.1)
Sodium: 138 mmol/L (ref 135–145)

## 2022-07-21 SURGERY — EXCISION, SUBMANDIBULAR GLAND
Anesthesia: General | Laterality: Left

## 2022-07-21 MED ORDER — FENTANYL CITRATE (PF) 250 MCG/5ML IJ SOLN
INTRAMUSCULAR | Status: AC
  Filled 2022-07-21: qty 5

## 2022-07-21 MED ORDER — BACITRACIN ZINC 500 UNIT/GM EX OINT
TOPICAL_OINTMENT | CUTANEOUS | Status: AC
Start: 2022-07-21 — End: ?
  Filled 2022-07-21: qty 28.35

## 2022-07-21 MED ORDER — ONDANSETRON HCL 4 MG/2ML IJ SOLN: 4.0000 mg | Freq: Once | INTRAMUSCULAR | Status: DC | PRN

## 2022-07-21 MED ORDER — DIPHENHYDRAMINE HCL 50 MG/ML IJ SOLN
INTRAMUSCULAR | Status: DC | PRN
  Administered 2022-07-21: 12.5 mg via INTRAVENOUS

## 2022-07-21 MED ORDER — MIDAZOLAM HCL 2 MG/2ML IJ SOLN
INTRAMUSCULAR | Status: AC
  Filled 2022-07-21: qty 2

## 2022-07-21 MED ORDER — DEXAMETHASONE SODIUM PHOSPHATE 10 MG/ML IJ SOLN
INTRAMUSCULAR | Status: DC | PRN
  Administered 2022-07-21: 10 mg via INTRAVENOUS

## 2022-07-21 MED ORDER — ACETAMINOPHEN 325 MG PO TABS: 325.0000 mg | ORAL_TABLET | ORAL | Status: DC | PRN

## 2022-07-21 MED ORDER — IBUPROFEN 100 MG/5ML PO SUSP: 400.0000 mg | Freq: Four times a day (QID) | ORAL | Status: DC | PRN

## 2022-07-21 MED ORDER — MIDAZOLAM HCL 2 MG/2ML IJ SOLN
INTRAMUSCULAR | Status: DC | PRN
  Administered 2022-07-21 (×2): 2 mg via INTRAVENOUS

## 2022-07-21 MED ORDER — OXYCODONE HCL 5 MG/5ML PO SOLN: 5.0000 mg | Freq: Once | ORAL | Status: DC | PRN

## 2022-07-21 MED ORDER — HYDROCODONE-ACETAMINOPHEN 5-325 MG PO TABS
1.0000 | ORAL_TABLET | ORAL | Status: DC | PRN
  Administered 2022-07-21 – 2022-07-22 (×3): 1 via ORAL
  Filled 2022-07-21 (×3): qty 1

## 2022-07-21 MED ORDER — DEXMEDETOMIDINE (PRECEDEX) IN NS 20 MCG/5ML (4 MCG/ML) IV SYRINGE
PREFILLED_SYRINGE | INTRAVENOUS | Status: DC | PRN
  Administered 2022-07-21 (×2): 20 ug via INTRAVENOUS

## 2022-07-21 MED ORDER — FENTANYL CITRATE (PF) 100 MCG/2ML IJ SOLN
INTRAMUSCULAR | Status: AC
  Filled 2022-07-21: qty 2

## 2022-07-21 MED ORDER — ONDANSETRON HCL 4 MG/2ML IJ SOLN: 4.0000 mg | INTRAMUSCULAR | Status: DC | PRN

## 2022-07-21 MED ORDER — 0.9 % SODIUM CHLORIDE (POUR BTL) OPTIME
TOPICAL | Status: DC | PRN
  Administered 2022-07-21: 1000 mL

## 2022-07-21 MED ORDER — HYDROCODONE-ACETAMINOPHEN 7.5-325 MG PO TABS
1.0000 | ORAL_TABLET | Freq: Four times a day (QID) | ORAL | 0 refills | Status: AC | PRN
Start: 2022-07-21 — End: ?

## 2022-07-21 MED ORDER — CHLORHEXIDINE GLUCONATE 0.12 % MT SOLN
15.0000 mL | Freq: Once | OROMUCOSAL | Status: AC
  Administered 2022-07-21: 15 mL via OROMUCOSAL
  Filled 2022-07-21: qty 15

## 2022-07-21 MED ORDER — ROCURONIUM BROMIDE 10 MG/ML (PF) SYRINGE
PREFILLED_SYRINGE | INTRAVENOUS | Status: AC
  Filled 2022-07-21: qty 30

## 2022-07-21 MED ORDER — LIDOCAINE-EPINEPHRINE 1 %-1:100000 IJ SOLN
INTRAMUSCULAR | Status: DC | PRN
  Administered 2022-07-21: 3 mL

## 2022-07-21 MED ORDER — ONDANSETRON HCL 4 MG PO TABS: 4.0000 mg | ORAL_TABLET | ORAL | Status: DC | PRN

## 2022-07-21 MED ORDER — FENTANYL CITRATE (PF) 100 MCG/2ML IJ SOLN
25.0000 ug | INTRAMUSCULAR | Status: DC | PRN
  Administered 2022-07-21: 50 ug via INTRAVENOUS

## 2022-07-21 MED ORDER — ACETAMINOPHEN 160 MG/5ML PO SOLN: 325.0000 mg | ORAL | Status: DC | PRN

## 2022-07-21 MED ORDER — FLUTICASONE PROPIONATE 50 MCG/ACT NA SUSP
1.0000 | Freq: Every day | NASAL | Status: DC | PRN
Start: 2022-07-21 — End: 2022-07-22

## 2022-07-21 MED ORDER — ALBUTEROL SULFATE (2.5 MG/3ML) 0.083% IN NEBU: 2.5000 mg | INHALATION_SOLUTION | Freq: Four times a day (QID) | RESPIRATORY_TRACT | Status: DC | PRN

## 2022-07-21 MED ORDER — ACETAMINOPHEN 500 MG PO TABS: 1000.0000 mg | ORAL_TABLET | Freq: Once | ORAL | Status: DC

## 2022-07-21 MED ORDER — LIDOCAINE-EPINEPHRINE 1 %-1:100000 IJ SOLN
INTRAMUSCULAR | Status: AC
  Filled 2022-07-21: qty 1

## 2022-07-21 MED ORDER — LIDOCAINE 2% (20 MG/ML) 5 ML SYRINGE
INTRAMUSCULAR | Status: DC | PRN
  Administered 2022-07-21: 50 mg via INTRAVENOUS

## 2022-07-21 MED ORDER — MEPERIDINE HCL 25 MG/ML IJ SOLN: 6.2500 mg | INTRAMUSCULAR | Status: DC | PRN

## 2022-07-21 MED ORDER — ROCURONIUM BROMIDE 10 MG/ML (PF) SYRINGE
PREFILLED_SYRINGE | INTRAVENOUS | Status: DC | PRN
  Administered 2022-07-21: 40 mg via INTRAVENOUS
  Administered 2022-07-21: 20 mg via INTRAVENOUS

## 2022-07-21 MED ORDER — ONDANSETRON 4 MG PO TBDP: 4.0000 mg | ORAL_TABLET | Freq: Three times a day (TID) | ORAL | 0 refills | Status: AC | PRN

## 2022-07-21 MED ORDER — FENTANYL CITRATE (PF) 250 MCG/5ML IJ SOLN
INTRAMUSCULAR | Status: DC | PRN
  Administered 2022-07-21 (×2): 100 ug via INTRAVENOUS
  Administered 2022-07-21: 50 ug via INTRAVENOUS

## 2022-07-21 MED ORDER — OXYCODONE HCL 5 MG PO TABS: 5.0000 mg | ORAL_TABLET | Freq: Once | ORAL | Status: DC | PRN

## 2022-07-21 MED ORDER — PROPOFOL 10 MG/ML IV BOLUS
INTRAVENOUS | Status: DC | PRN
  Administered 2022-07-21: 170 mg via INTRAVENOUS

## 2022-07-21 MED ORDER — ORAL CARE MOUTH RINSE: 15.0000 mL | Freq: Once | OROMUCOSAL | Status: AC

## 2022-07-21 MED ORDER — LACTATED RINGERS IV SOLN: INTRAVENOUS | Status: DC

## 2022-07-21 MED ORDER — PROPOFOL 500 MG/50ML IV EMUL
INTRAVENOUS | Status: DC | PRN
  Administered 2022-07-21: 75 ug/kg/min via INTRAVENOUS

## 2022-07-21 MED ORDER — DEXAMETHASONE SODIUM PHOSPHATE 10 MG/ML IJ SOLN
INTRAMUSCULAR | Status: AC
  Filled 2022-07-21: qty 2

## 2022-07-21 MED ORDER — CELECOXIB 200 MG PO CAPS: 200.0000 mg | ORAL_CAPSULE | Freq: Once | ORAL | Status: DC

## 2022-07-21 MED ORDER — ONDANSETRON HCL 4 MG/2ML IJ SOLN
INTRAMUSCULAR | Status: DC | PRN
  Administered 2022-07-21 (×2): 4 mg via INTRAVENOUS

## 2022-07-21 MED ORDER — ONDANSETRON HCL 4 MG/2ML IJ SOLN
INTRAMUSCULAR | Status: AC
  Filled 2022-07-21: qty 4

## 2022-07-21 MED ORDER — DEXTROSE-NACL 5-0.9 % IV SOLN: INTRAVENOUS | Status: DC

## 2022-07-21 SURGICAL SUPPLY — 52 items
ATTRACTOMAT 16X20 MAGNETIC DRP (DRAPES) IMPLANT
BAG COUNTER SPONGE SURGICOUNT (BAG) ×2 IMPLANT
BLADE SURG 15 STRL LF DISP TIS (BLADE) IMPLANT
BLADE SURG 15 STRL SS (BLADE)
CANISTER SUCT 3000ML PPV (MISCELLANEOUS) ×2 IMPLANT
CLEANER TIP ELECTROSURG 2X2 (MISCELLANEOUS) ×2 IMPLANT
CNTNR URN SCR LID CUP LEK RST (MISCELLANEOUS) ×1 IMPLANT
CONT SPEC 4OZ STRL OR WHT (MISCELLANEOUS) ×2
CORD BIPOLAR FORCEPS 12FT (ELECTRODE) ×2 IMPLANT
COVER SURGICAL LIGHT HANDLE (MISCELLANEOUS) ×2 IMPLANT
DERMABOND ADVANCED (GAUZE/BANDAGES/DRESSINGS) ×2
DERMABOND ADVANCED .7 DNX12 (GAUZE/BANDAGES/DRESSINGS) ×1 IMPLANT
DRAIN JACKSON RD 7FR 3/32 (WOUND CARE) ×1 IMPLANT
DRAIN PENROSE 1/4X12 LTX STRL (WOUND CARE) ×1 IMPLANT
DRAIN SNY 10 ROU (WOUND CARE) IMPLANT
DRAPE HALF SHEET 40X57 (DRAPES) IMPLANT
DRAPE SURG 17X23 STRL (DRAPES) ×2 IMPLANT
ELECT COATED BLADE 2.86 ST (ELECTRODE) ×2 IMPLANT
ELECT REM PT RETURN 9FT ADLT (ELECTROSURGICAL) ×2
ELECTRODE REM PT RTRN 9FT ADLT (ELECTROSURGICAL) ×1 IMPLANT
EVACUATOR SILICONE 100CC (DRAIN) ×1 IMPLANT
FORCEPS BIPOLAR SPETZLER 8 1.0 (NEUROSURGERY SUPPLIES) ×2 IMPLANT
GAUZE 4X4 16PLY ~~LOC~~+RFID DBL (SPONGE) ×1 IMPLANT
GLOVE ECLIPSE 7.5 STRL STRAW (GLOVE) ×2 IMPLANT
GOWN STRL REUS W/ TWL LRG LVL3 (GOWN DISPOSABLE) ×2 IMPLANT
GOWN STRL REUS W/TWL LRG LVL3 (GOWN DISPOSABLE) ×4
KIT BASIN OR (CUSTOM PROCEDURE TRAY) ×2 IMPLANT
KIT TURNOVER KIT B (KITS) ×2 IMPLANT
LOCATOR NERVE 3 VOLT (DISPOSABLE) IMPLANT
NDL PRECISIONGLIDE 27X1.5 (NEEDLE) ×1 IMPLANT
NEEDLE PRECISIONGLIDE 27X1.5 (NEEDLE) ×2 IMPLANT
NS IRRIG 1000ML POUR BTL (IV SOLUTION) ×2 IMPLANT
PAD ARMBOARD 7.5X6 YLW CONV (MISCELLANEOUS) ×4 IMPLANT
PENCIL FOOT CONTROL (ELECTRODE) ×2 IMPLANT
SHEARS HARMONIC 9CM CVD (BLADE) IMPLANT
SPECIMEN JAR SMALL (MISCELLANEOUS) ×2 IMPLANT
STAPLER VISISTAT 35W (STAPLE) ×1 IMPLANT
SUT CHROMIC 3 0 PS 2 (SUTURE) IMPLANT
SUT CHROMIC 4 0 PS 2 18 (SUTURE) ×1 IMPLANT
SUT ETHILON 2 0 FS 18 (SUTURE) IMPLANT
SUT ETHILON 3 0 PS 1 (SUTURE) IMPLANT
SUT ETHILON 5 0 P 3 18 (SUTURE)
SUT NYLON ETHILON 5-0 P-3 1X18 (SUTURE) IMPLANT
SUT SILK 2 0 REEL (SUTURE) IMPLANT
SUT SILK 2 0 SH CR/8 (SUTURE) IMPLANT
SUT SILK 3 0 PS 1 (SUTURE) ×2 IMPLANT
SUT SILK 3 0 REEL (SUTURE) ×1 IMPLANT
SUT SILK 4 0 REEL (SUTURE) ×1 IMPLANT
SUT VIC AB 3-0 FS2 27 (SUTURE) IMPLANT
SUT VICRYL 4-0 PS2 18IN ABS (SUTURE) IMPLANT
TRAY ENT MC OR (CUSTOM PROCEDURE TRAY) ×2 IMPLANT
WATER STERILE IRR 1000ML POUR (IV SOLUTION) ×2 IMPLANT

## 2022-07-21 NOTE — Discharge Instructions (Signed)
You may shower and use soap and water. Do not use any creams, oils or ointment. ° °

## 2022-07-21 NOTE — Progress Notes (Signed)
   ENT Progress Note: s/p Procedure(s): EXCISION SUBMANDIBULAR GLAND   Subjective: Sleepy, mild pain  Objective: Vital signs in last 24 hours: Temp:  [98.3 F (36.8 C)-99.2 F (37.3 C)] 98.9 F (37.2 C) (07/26 1625) Pulse Rate:  [57-79] 64 (07/26 1640) Resp:  [12-20] 14 (07/26 1640) BP: (91-112)/(53-65) 98/55 (07/26 1640) SpO2:  [95 %-99 %] 96 % (07/26 1640) Weight:  [68 kg] 68 kg (07/26 1044) Weight change:     Intake/Output from previous day: No intake/output data recorded. Intake/Output this shift: No intake/output data recorded.  Labs: Recent Labs    07/21/22 1055  WBC 5.3  HGB 14.6  HCT 42.1  PLT 199   Recent Labs    07/21/22 1055  NA 138  K 3.5  CL 107  CO2 23  GLUCOSE 78  BUN 11  CALCIUM 8.9    Studies/Results: No results found.   PHYSICAL EXAM: Inc intact, no swelling or erythema JP functioning, min drainage   Assessment/Plan: Pt stable postop Monitor O/N    Jerrell Belfast 07/21/2022, 5:22 PM

## 2022-07-21 NOTE — Transfer of Care (Signed)
Immediate Anesthesia Transfer of Care Note  Patient: Sydney [name]  Procedure(s) Performed: EXCISION SUBMANDIBULAR GLAND (Left)  Patient Location: PACU  Anesthesia Type:General  Level of Consciousness: sedated  Airway & Oxygen Therapy: Patient Spontanous Breathing and Patient connected to nasal cannula oxygen  Post-op Assessment: Report given to RN and Post -op Vital signs reviewed and stable  Post vital signs: Reviewed and stable  Last Vitals:  Vitals Value Taken Time  BP 102/55 07/21/22 1510  Temp 37.3 C 07/21/22 1510  Pulse 78 07/21/22 1512  Resp 21 07/21/22 1512  SpO2 95 % 07/21/22 1512  Vitals shown include unvalidated device data.  Last Pain:  Vitals:   07/21/22 1055  PainSc: 0-No pain         Complications: No notable events documented.

## 2022-07-21 NOTE — Anesthesia Postprocedure Evaluation (Signed)
Anesthesia Post Note  Patient: Sydney [name]  Procedure(s) Performed: EXCISION SUBMANDIBULAR GLAND (Left)     Patient location during evaluation: PACU Anesthesia Type: General Level of consciousness: awake and alert Pain management: pain level controlled Vital Signs Assessment: post-procedure vital signs reviewed and stable Respiratory status: spontaneous breathing, nonlabored ventilation, respiratory function stable and patient connected to nasal cannula oxygen Cardiovascular status: blood pressure returned to baseline and stable Postop Assessment: no apparent nausea or vomiting Anesthetic complications: no   No notable events documented.  Last Vitals:  Vitals:   07/21/22 1816 07/21/22 2058  BP: 106/65 106/61  Pulse: 63 65  Resp: 16   Temp: 36.6 C 36.7 C  SpO2: 100% 100%    Last Pain:  Vitals:   07/21/22 2124  TempSrc:   PainSc: 8                  [name]

## 2022-07-21 NOTE — Op Note (Signed)
OPERATIVE REPORT  DATE OF SURGERY: 07/21/2022  PATIENT:  Sydney [name],  31 y.o. female  PRE-OPERATIVE DIAGNOSIS:  Left Submandibular sialolithiasis  POST-OPERATIVE DIAGNOSIS:  Left Submandibular sialolithiasis  PROCEDURE:  Procedure(s): EXCISION SUBMANDIBULAR GLAND  SURGEON:  Beckie Salts, MD  ASSISTANTS: None  ANESTHESIA:   General   EBL: 30 ml  DRAINS: 7 [place] round JP  LOCAL MEDICATIONS USED: 1% Xylocaine with epinephrine  SPECIMEN: Left submandibular gland with associated facial nodes and calculus  COUNTS:  Correct  PROCEDURE DETAILS: The patient was taken to the operating room and placed on the operating table in the supine position. Following induction of general endotracheal anesthesia, the left neck was prepped and draped in a standard fashion.  A transverse incision was outlined with a marking pen about 2 fingerbreadths below the mandible.  The incision was infiltrated with local anesthetic solution.  #15 scalpel was used to incise the skin the subcutaneous tissue.  The platysma was divided.  A subplatysmal flap was elevated superiorly towards the mandible.  The marginal mandibular branch of the facial nerve was identified and reflected superiorly with associated soft tissue.  Facial nodes were identified and were brought down with the specimen.  The facial vessels were separately ligated between clamps and divided.  4-0 silk ties were used.  Soft tissue was dissected off of the mylohyoid muscle.  The muscle was then reflected anteriorly.  The submandibular ganglion was sacrificed and the lingual nerve was preserved.  In this portion of the gland adjacent to the duct a large calculus was palpable.  This was kept with the specimen.  The duct was ligated between clamps and divided.  The specimen was brought down off of the digastric muscle and the lingual artery was ligated between clamps and divided.  Specimen was delivered and sent for pathologic valuation.  The wound  was irrigated with saline.  Bipolar cautery was used for completion of hemostasis.  The drain was exited through a separate stab incision posteriorly.  Skin this was secured in place with a silk suture.  Skin was reapproximated in 2 layers using interrupted 4-0 chromic, platysma and running subcuticular 4-0 chromic on the skin.  Dermabond was used on the skin surface.  The drain was charged.  Patient was awakened extubated and transferred to recovery in stable condition.    PATIENT DISPOSITION:  To PACU, stable

## 2022-07-21 NOTE — Anesthesia Procedure Notes (Signed)
Procedure Name: Intubation Date/Time: 07/21/2022 2:17 PM  Performed by: Minerva Ends, CRNAPre-anesthesia Checklist: Patient identified, Emergency Drugs available, Suction available and Patient being monitored Patient Re-evaluated:Patient Re-evaluated prior to induction Oxygen Delivery Method: Circle system utilized Preoxygenation: Pre-oxygenation with 100% oxygen Induction Type: IV induction Ventilation: Mask ventilation without difficulty Laryngoscope Size: Mac and 3 Grade View: Grade I Tube type: Oral Tube size: 7.0 mm Number of attempts: 1 Airway Equipment and Method: Stylet and Oral airway Placement Confirmation: ETT inserted through vocal cords under direct vision, positive ETCO2 and breath sounds checked- equal and bilateral Secured at: 23 cm Tube secured with: Tape Dental Injury: Teeth and Oropharynx as per pre-operative assessment

## 2022-07-21 NOTE — Interval H&P Note (Signed)
History and Physical Interval Note:  07/21/2022 1:09 PM  Sydney [name]  has presented today for surgery, with the diagnosis of Left Submandibular sialolithiasis.  The various methods of treatment have been discussed with the patient and family. After consideration of risks, benefits and other options for treatment, the patient has consented to  Procedure(s): EXCISION SUBMANDIBULAR GLAND (Left) as a surgical intervention.  The patient's history has been reviewed, patient examined, no change in status, stable for surgery.  I have reviewed the patient's chart and labs.  Questions were answered to the patient's satisfaction.     [name]

## 2022-07-21 NOTE — Anesthesia Preprocedure Evaluation (Signed)
Anesthesia Evaluation  Patient identified by MRN, date of birth, ID band Patient awake    Reviewed: Allergy & Precautions, H&P , NPO status , Patient's Chart, lab work & pertinent test results, reviewed documented beta blocker date and time   History of Anesthesia Complications (+) PONV  Airway Mallampati: II  TM Distance: >3 FB Neck ROM: full    Dental no notable dental hx. (+) Teeth Intact, Dental Advisory Given   Pulmonary asthma ,    Pulmonary exam normal breath sounds clear to auscultation       Cardiovascular Exercise Tolerance: Good negative cardio ROS   Rhythm:regular Rate:Normal     Neuro/Psych  Headaches, PSYCHIATRIC DISORDERS Anxiety Depression    GI/Hepatic negative GI ROS, Neg liver ROS,   Endo/Other  negative endocrine ROS  Renal/GU negative Renal ROS  negative genitourinary   Musculoskeletal   Abdominal   Peds  Hematology negative hematology ROS (+) Blood dyscrasia, anemia ,   Anesthesia Other Findings   Reproductive/Obstetrics negative OB ROS                             Anesthesia Physical Anesthesia Plan  ASA: 2  Anesthesia Plan: General   Post-op Pain Management: Minimal or no pain anticipated, Tylenol PO (pre-op)* and Celebrex PO (pre-op)*   Induction: Intravenous  PONV Risk Score and Plan: 3 and Treatment may vary due to age or medical condition, Ondansetron and Dexamethasone  Airway Management Planned: Oral ETT  Additional Equipment: None  Intra-op Plan:   Post-operative Plan: Extubation in OR  Informed Consent: I have reviewed the patients History and Physical, chart, labs and discussed the procedure including the risks, benefits and alternatives for the proposed anesthesia with the patient or authorized representative who has indicated his/her understanding and acceptance.     Dental Advisory Given  Plan Discussed with: CRNA and  Anesthesiologist  Anesthesia Plan Comments: (  )        Anesthesia Quick Evaluation

## 2022-07-22 ENCOUNTER — Encounter (HOSPITAL_COMMUNITY): Payer: Self-pay | Admitting: Otolaryngology

## 2022-07-22 DIAGNOSIS — K115 Sialolithiasis: Secondary | ICD-10-CM | POA: Diagnosis not present

## 2022-07-22 NOTE — Discharge Summary (Signed)
Physician Discharge Summary  Patient ID: Sydney [name] MRN: [id number] DOB/AGE: 08-11-1991 31 y.o.  Admit date: 07/21/2022 Discharge date: 07/22/2022  Admission Diagnoses: Submandibular sialolithiasis  Discharge Diagnoses:  Principal Problem:   Sialolithiasis of submandibular gland   Discharged Condition: good  Hospital Course: No complications  Consults: none  Significant Diagnostic Studies: none  Treatments: surgery: Removal of left submandibular gland  Discharge Exam: Blood pressure 105/64, pulse 61, temperature 98.7 F (37.1 C), temperature source Oral, resp. rate 17, height '5\' 7"'$  (1.702 m), weight 68 kg, SpO2 99 %. PHYSICAL EXAM: Awake and alert.  Slight weakness of the lower lip.  Incision looks excellent.  Drain functioning.  Disposition: Discharge disposition: 01-Home or Self Care       Discharge Instructions     Diet - low sodium heart healthy   Complete by: As directed    Discharge wound care:   Complete by: As directed    Empty and recharge drain every 8 hours.   Increase activity slowly   Complete by: As directed       Allergies as of 07/22/2022       Reactions   Cherry Anaphylaxis   Almond (diagnostic) Itching   Apple Hives, Swelling   Raw fruit   Fruit Extracts Itching, Swelling   Pt states it happens with most raw fruits   Latex Itching   Black Walnut Pollen Allergy Skin Test Itching        Medication List     TAKE these medications    albuterol 108 (90 Base) MCG/ACT inhaler Commonly known as: VENTOLIN HFA Inhale 2 puffs into the lungs every 6 (six) hours as needed ([place]).   fluticasone 50 MCG/ACT nasal spray Commonly known as: FLONASE Place 1 spray into both nostrils daily as needed for allergies or rhinitis.   HYDROcodone-acetaminophen 7.5-325 MG tablet Commonly known as: Norco Take 1 tablet by mouth every 6 (six) hours as needed for moderate pain.   LILETTA (52 MG) IU 1 Device by Intrauterine route continuous.    ondansetron 4 MG disintegrating tablet Commonly known as: ZOFRAN-ODT Take 1 tablet (4 mg total) by mouth every 8 (eight) hours as needed for nausea or vomiting.               Discharge Care Instructions  (From admission, onward)           Start     Ordered   07/22/22 0000  Discharge wound care:       Comments: Empty and recharge drain every 8 hours.   07/22/22 3335            Follow-up Information     [name], MD Follow up on 07/23/2022.   Specialty: Otolaryngology Why: Come to the office at 1 PM Friday. Contact information: [address] [place] [address] [phone]                 Signed: [name] 07/22/2022, 8:58 AM

## 2022-07-22 NOTE — Plan of Care (Signed)

## 2022-07-23 LAB — SURGICAL PATHOLOGY

## 2022-08-01 IMAGING — CT CT NECK W/ CM
3 of 4 series · 12 of 33 positions shown, 14 images · IV contrast (Omni 300)
Comparison: CT neck 01/06/2010

CLINICAL DATA: Soft tissue swelling. Infection suspected. Personal
history of salivary gland stones. Left-sided neck swelling.

EXAM:
CT NECK WITH CONTRAST
TECHNIQUE: Multidetector CT imaging of the neck was performed using the
standard protocol following the bolus administration of intravenous
contrast.

[Series 3: neck 2.0 st · axial · 0.34mm/px · z∈[-202,+14]mm · 4 of 163 slices shown, 5 images]
[im 28/163  soft-tissue]
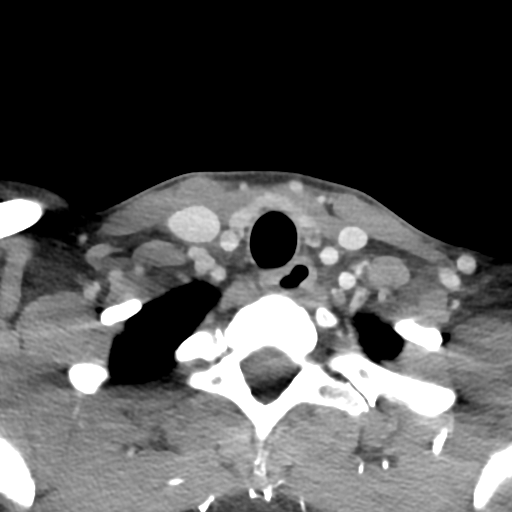
[im 28/163  bone]
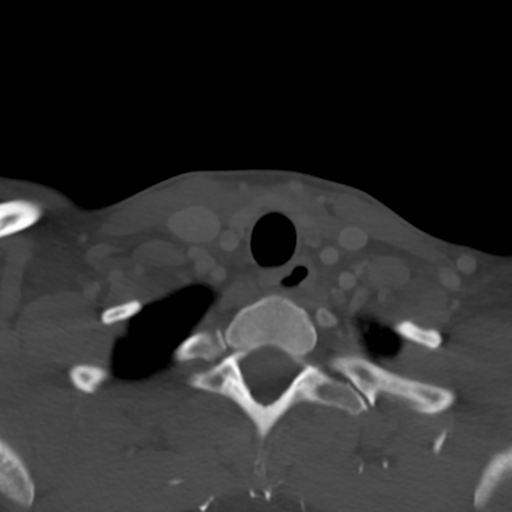
[im 55/163  bone]
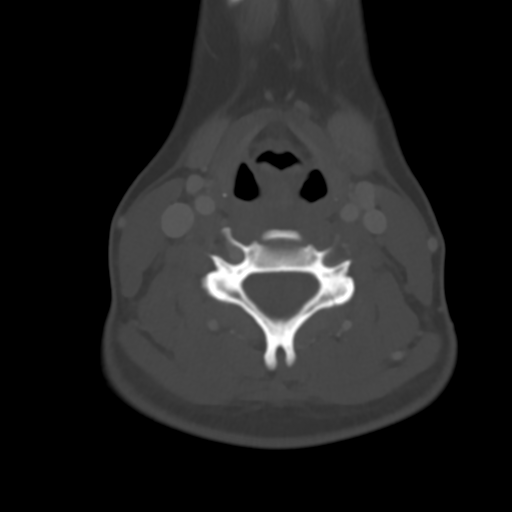
[im 109/163  bone]
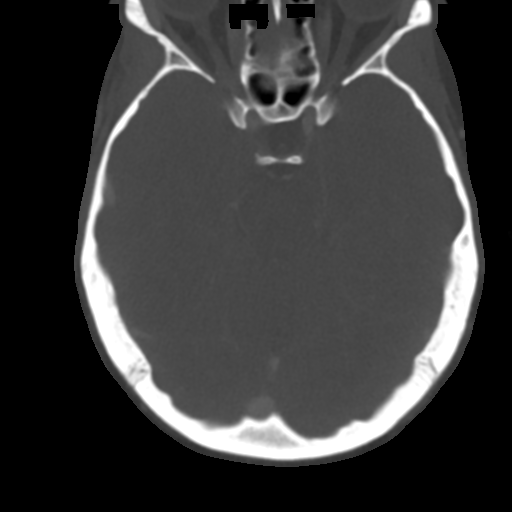
[im 136/163  bone]
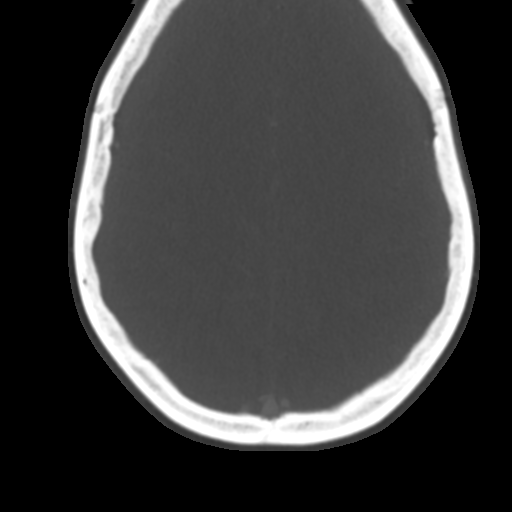

[Series 4: sagittal · sagittal · 0.45mm/px · 5 of 75 slices shown, 6 images]
[im 25/75  bone]
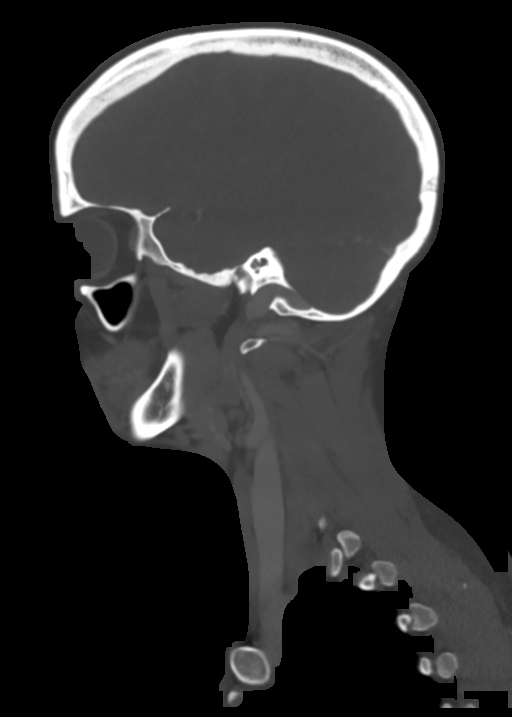
[im 31/75  bone]
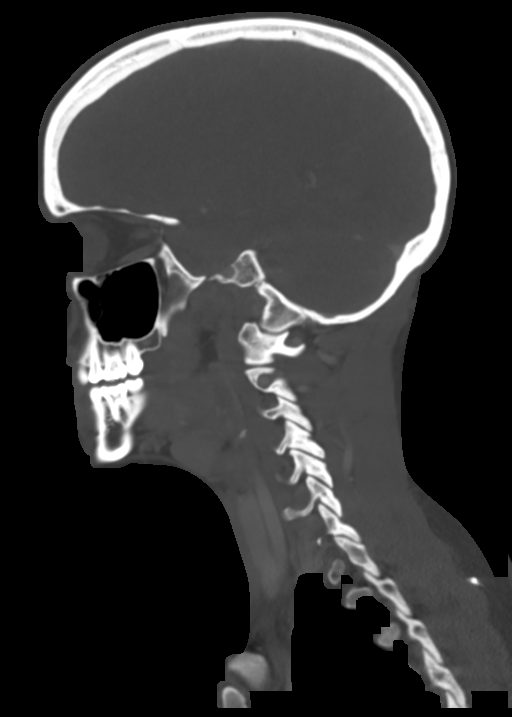
[im 38/75  soft-tissue]
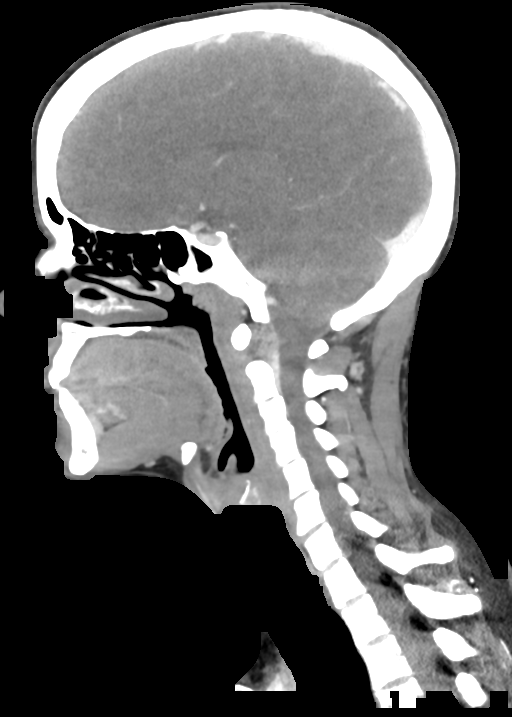
[im 38/75  bone]
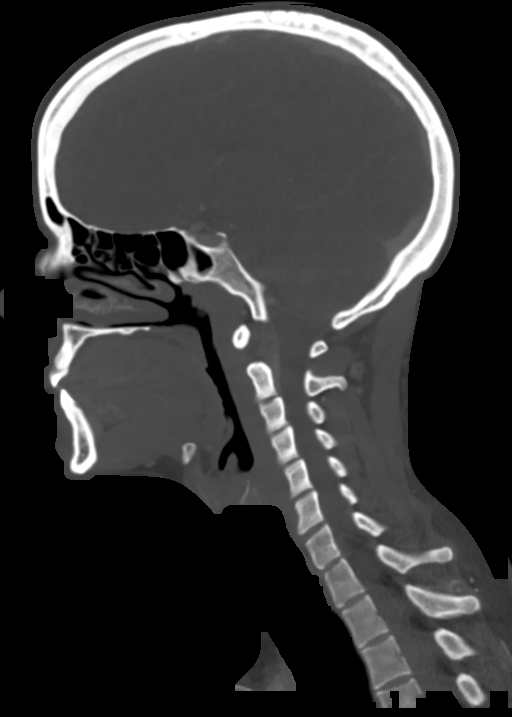
[im 44/75  bone]
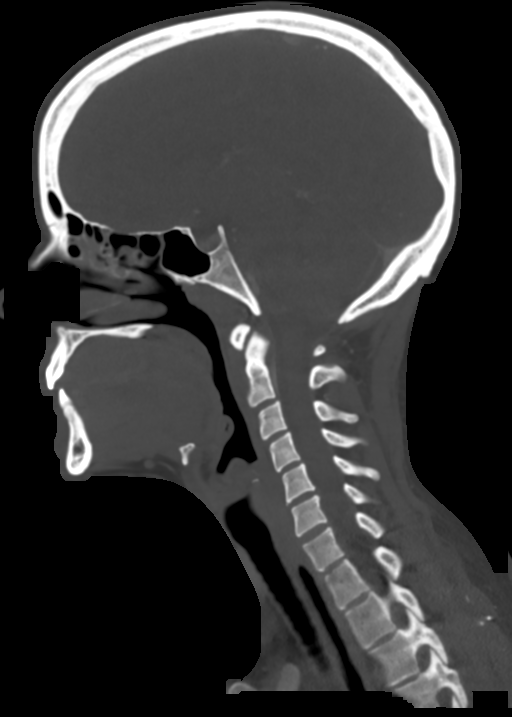
[im 50/75  bone]
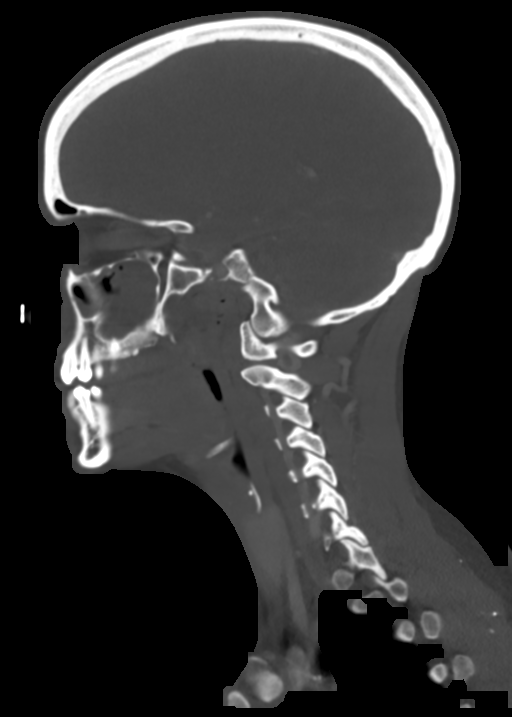

[Series 5: coronal · coronal · 0.37mm/px · 3 of 103 slices shown]
[im 21/103  bone]
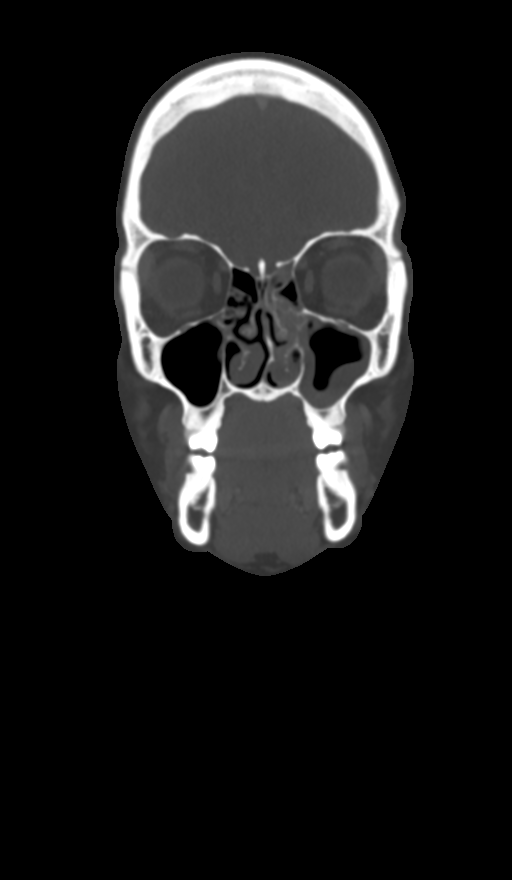
[im 41/103  bone]
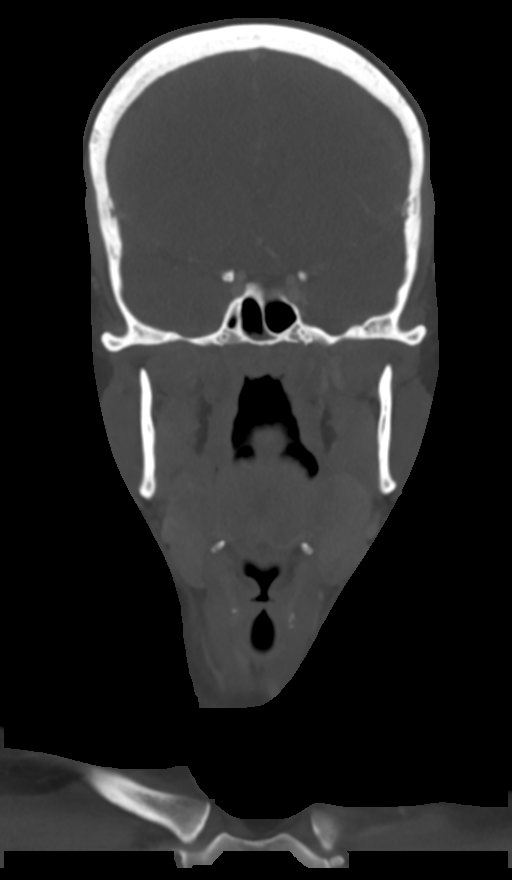
[im 62/103  bone]
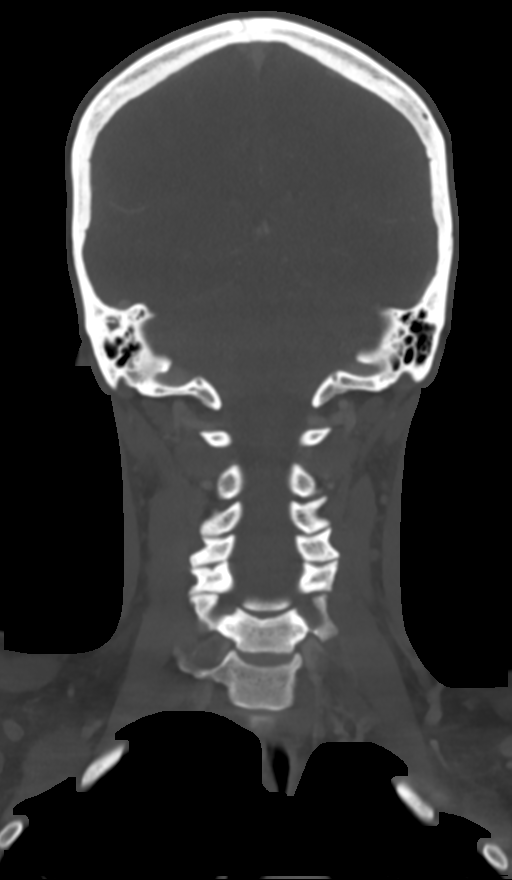

[12 of 33 positions shown; findings below may reference images not displayed]

RADIATION DOSE REDUCTION: This exam was performed according to the
departmental dose-optimization program which includes automated
exposure control, adjustment of the mA and/or kV according to
patient size and/or use of iterative reconstruction technique.

CONTRAST:  100mL OMNIPAQUE IOHEXOL 300 MG/ML  SOLN
FINDINGS: Pharynx and larynx: No focal mucosal or submucosal lesions are
present. The nasopharynx and soft palate are within normal limits.
Tongue base and palatine tonsils are within normal limits. The
parapharyngeal fat is clear. Vallecula and epiglottis are within
normal limits. Aryepiglottic folds and piriform sinuses are clear.
Vocal cords are midline and symmetric. Trachea is clear.

Salivary glands: Calcification in the superomedial aspect of the
left submandibular gland measures 9 x 7 x 7 mm. There is some edema
around the stone. No significant intra glandular dilation is
present. The distal duct is within normal limits. The right
submandibular gland and duct is normal. Parotid glands and ducts are
within normal limits bilaterally.

Thyroid: Normal thyroid

Lymph nodes: 2 left level 2 lymph nodes measure 15 x 11 mm and 17 x
11 mm respectively. Smaller left submandibular nodes are present. No
significant right-sided adenopathy is present. No necrotic nodes are
present.

Vascular: No significant vascular lesions are present.

Limited intracranial: Within normal limits.

Visualized orbits: The globes and orbits are within normal limits.

Mastoids and visualized paranasal sinuses: Fluid level is present in
the left maxillary sinus. Asymmetric mucosal disease is present in
the left ethmoid air cells. The paranasal sinuses and mastoid air
cells are otherwise clear.

Skeleton: Vertebral body heights and alignment are normal. No focal
osseous lesions are present.

Upper chest: Lung apices are clear. Thoracic inlet is within normal
limits.
IMPRESSION: 1. 9 x 7 x 7 mm calcification in the superomedial aspect of the left
submandibular gland with some edema around the stone. No significant
intra glandular dilation is present.
2. Enlarged left level 2 and submandibular lymph nodes are likely
reactive.
3. Acute left maxillary sinusitis.

## 2022-11-03 NOTE — ED Provider Notes (Addendum)
 [facility]  ED Provider Note  Sydney [name] 31 y.o. female DOB: Feb 04, 1991 MRN: [id number] History   Chief Complaint  Patient presents with  . Abdominal Pain    Pt reports generalized abd pain x 2 weeks. Vomiting episodes when pain first started, reports diarrhea entire 2 weeks.    The patient is a pleasant 31 year old female who presents complaining of a 1-1/2-week history of abdominal pain with persistent nausea she had vomiting initially 1-1/2 weeks ago but no recurrent vomiting she does describe some associated loose bowels has been no associated fevers or chills the patient states that she is unable to eat effectively and now is having recurrent belching of sulfurous tasting gas The patient has no associated dysuria frequency urgency no associated hematemesis melena or hematochezia the patient has no cough no shortness of breath and is otherwise normally quite healthy.   History provided by:  Patient Abdominal Pain Pain location:  RUQ and epigastric Pain quality:  Aching and cramping Pain radiates to:  Does not radiate Onset quality:  Gradual Duration:  10 days Timing:  Constant Progression:  Waxing and waning Context: retching   Context: no alcohol use, not awakening from sleep, no diet changes, not eating, no medication withdrawal, no previous surgeries, no recent illness, no sick contacts, no suspicious food intake and no trauma   Relieved by:  Nothing Worsened by:  Nothing Ineffective treatments:  Eating Associated symptoms: nausea and vomiting   Associated symptoms: no anorexia, no belching, no chest pain, no chills, no constipation, no cough, no dysuria, no fever, no hematemesis, no hematochezia and no shortness of breath   Risk factors: no alcohol abuse, no aspirin use and has not had multiple surgeries       Past Medical History:  Diagnosis Date  . Allergy 1999  . Anemia 2016  . Anxiety 2019   No longer much of an issue  .  Asthma   . GERD (gastroesophageal reflux disease) 2016   During pregnancies  . Hemorrhoids 2016   Pregnancy  . Hypertension 2016   Gestational hypertension  . Ulcer 2008   Ulcers in the mouth    Past Surgical History:  Procedure Laterality Date  . Cesarean section  2016  . Lipoma resection Right 2015  . Salivary gland surgery  2014  . Tonsillectomy and adenoidectomy  2011  . Wisdom tooth extraction  2012    Social History   Substance and Sexual Activity  Alcohol Use Never   Social History   Tobacco Use  Smoking Status Former  Smokeless Tobacco Current   E-Cigarettes  . Vaping Use    . Start Date    . Cartridges/Day    . Quit Date     Social History   Substance and Sexual Activity  Drug Use Never         Allergies  Allergen Reactions  . Apple Anaphylaxis  . Black Walnut Pollen Allergy Skin Test Itching    Almonds  . Cherry Anaphylaxis  . Flavoring Agent Itching    Almonds  . Latex Itching  . Nuts Itching    Almonds  . Other Itching    Raw Fruit only    Home Medications   ALBUTEROL  SULFATE HFA (PROVENTIL ,VENTOLIN ,PROAIR ) 108 (90 BASE) MCG/ACT INHALER    Inhale two puffs into the lungs every 6 (six) hours as needed for Wheezing.   AMPHETAMINE-DEXTROAMPHETAMINE (ADDERALL) 20 MG TABLET    Take one tablet (20 mg dose) by mouth 2 (  two) times daily for 30 days.   CEPHALEXIN  (KEFLEX ) 500 MG CAPSULE    Take one capsule (500 mg dose) by mouth 2 (two) times daily.   ERGOCALCIFEROL (VITAMIN D2) 50,000 UNITS CAPS CAPSULE    Take one capsule (50,000 Units dose) by mouth once a week at 0900 for 12 doses.   FLUTICASONE  PROPIONATE (FLONASE ) 50 MCG/ACTUATION NASAL SPRAY    by Nasal route.   LEVONORGESTREL  (LILETTA ) IUD    by Intrauterine route continuous.   METOPROLOL SUCCINATE (TOPROL XL) 25 MG 24 HR TABLET    Take one tablet (25 mg dose) by mouth daily.    Review of Systems   Review of Systems  Constitutional: Negative.  Negative for chills and fever.  HENT:  Negative.   Respiratory: Negative.  Negative for cough, choking, shortness of breath and wheezing.   Cardiovascular: Negative for chest pain.  Gastrointestinal: Positive for abdominal pain, nausea and vomiting. Negative for anorexia, constipation, hematemesis and hematochezia.  Genitourinary: Negative.  Negative for decreased urine volume, dysuria, frequency and urgency.  Musculoskeletal: Negative.  Negative for joint swelling, myalgias and neck pain.  Skin: Negative.  Negative for rash.  Neurological: Negative.  Negative for dizziness, weakness, light-headedness and numbness.  Psychiatric/Behavioral: Negative.   All other systems reviewed and are negative.   Physical Exam   ED Triage Vitals [11/03/22 0815]  BP 125/85  Heart Rate 95  Resp 18  SpO2 100 %  Temp 97.5 F (36.4 C)    Physical Exam  Constitutional: She appears well-developed and well-nourished. She no respiratory distress.  HENT:  Head: Normocephalic and atraumatic.  Nose: Nose normal.  Mouth/Throat: Voice normal.  Eyes: EOM are intact. Pupils are equal, round, and reactive to light. No scleral icterus.  Neck: Normal range of motion and voice normal. Neck supple. No nuchal rigidity.  Cardiovascular: Normal heart sounds and intact distal pulses.  No audible murmur. No friction rub and gallop.  Pulmonary/Chest: No respiratory distress. Good air movement. Not tachypneic. Respiratory effort normal and breath sounds normal. No chest wall tenderness. No wheezing. No rhonchi.  Abdominal: Soft. There is abdominal tenderness in the right upper quadrant. There is rebound. There is no guarding. Bowel sounds are normal. Normal appearance. No neither ascites nor fluid wave present. No mass present. There is no rigidity. There is a positive Murphy's sign. There is no tenderness at McBurney's point.  Musculoskeletal: Normal range of motion.     Cervical back: Normal range of motion and neck supple.   Neurological: She is alert and  oriented to person, place, and time. She has normal speech.  Skin: Skin is warm. Skin is dry. No rash noted.  Psychiatric: She has a normal mood and affect. Her behavior is normal. Thought content normal.    ED Course   Lab results: No data to display  Imaging: No data to display  ECG: ECG Results   None                               Pre-Sedation Procedures    Medical Decision Making The patient's white blood cell count was normal The urinalysis showed no evidence of infection the electrolytes were unremarkable.  The patient had a ultrasound which showed no evidence of cholecystitis there were no biliary stones noted the patient had no recurrence of symptoms while in the emergency department and felt somewhat better after IV hydration and will be discharged home asked to  follow-up with her primary caregiver in 3 to 5 days.  The patient does describe daily recreational marijuana use and was recommended to discontinue marijuana use pending completion of the evaluation  Amount and/or Complexity of Data Reviewed Labs: ordered. Decision-making details documented in ED Course. Radiology: ordered.   Risk Prescription drug management.        Provider Communication  New Prescriptions   No medications on file    Modified Medications   No medications on file    Discontinued Medications   No medications on file    Clinical Impression   Final diagnoses:  None    ED Disposition    None                Electronically signed by:   Elsie LELON Peon, MD 11/03/22 1524    Elsie LELON Peon, MD 11/03/22 1527    Elsie LELON Peon, MD 11/04/22 [phone]

## 2024-07-16 ENCOUNTER — Encounter (HOSPITAL_COMMUNITY): Payer: Self-pay

## 2024-07-16 ENCOUNTER — Emergency Department (HOSPITAL_COMMUNITY)

## 2024-07-16 ENCOUNTER — Emergency Department (HOSPITAL_COMMUNITY)
Admission: EM | Admit: 2024-07-16 | Discharge: 2024-07-17 | Disposition: A | Discharge status: Home / Self Care | Attending: Emergency Medicine | Admitting: Emergency Medicine

## 2024-07-16 ENCOUNTER — Other Ambulatory Visit: Payer: Self-pay

## 2024-07-16 DIAGNOSIS — E871 Hypo-osmolality and hyponatremia: Secondary | ICD-10-CM | POA: Diagnosis not present

## 2024-07-16 DIAGNOSIS — D72819 Decreased white blood cell count, unspecified: Secondary | ICD-10-CM | POA: Diagnosis not present

## 2024-07-16 DIAGNOSIS — R059 Cough, unspecified: Secondary | ICD-10-CM | POA: Insufficient documentation

## 2024-07-16 DIAGNOSIS — J45909 Unspecified asthma, uncomplicated: Secondary | ICD-10-CM | POA: Diagnosis not present

## 2024-07-16 DIAGNOSIS — R0602 Shortness of breath: Secondary | ICD-10-CM | POA: Diagnosis present

## 2024-07-16 DIAGNOSIS — J101 Influenza due to other identified influenza virus with other respiratory manifestations: Secondary | ICD-10-CM | POA: Insufficient documentation

## 2024-07-16 DIAGNOSIS — Z91040 Latex allergy status: Secondary | ICD-10-CM | POA: Diagnosis not present

## 2024-07-16 LAB — CBC WITH DIFFERENTIAL/PLATELET
Abs Immature Granulocytes: 0.01 K/uL (ref 0.00–0.07)
Basophils Absolute: 0 K/uL (ref 0.0–0.1)
Basophils Relative: 1 %
Eosinophils Absolute: 0 K/uL (ref 0.0–0.5)
Eosinophils Relative: 0 %
HCT: 37.2 % (ref 36.0–46.0)
Hemoglobin: 13 g/dL (ref 12.0–15.0)
Immature Granulocytes: 0 %
Lymphocytes Relative: 25 %
Lymphs Abs: 0.7 K/uL (ref 0.7–4.0)
MCH: 31.8 pg (ref 26.0–34.0)
MCHC: 34.9 g/dL (ref 30.0–36.0)
MCV: 91 fL (ref 80.0–100.0)
Monocytes Absolute: 0.5 K/uL (ref 0.1–1.0)
Monocytes Relative: 18 %
Neutro Abs: 1.6 K/uL — ABNORMAL LOW (ref 1.7–7.7)
Neutrophils Relative %: 56 %
Platelets: 183 K/uL (ref 150–400)
RBC: 4.09 MIL/uL (ref 3.87–5.11)
RDW: 11.9 % (ref 11.5–15.5)
WBC: 2.8 K/uL — ABNORMAL LOW (ref 4.0–10.5)
nRBC: 0 % (ref 0.0–0.2)

## 2024-07-16 LAB — COMPREHENSIVE METABOLIC PANEL WITH GFR
ALT: 20 U/L (ref 0–44)
AST: 26 U/L (ref 15–41)
Albumin: 4.4 g/dL (ref 3.5–5.0)
Alkaline Phosphatase: 39 U/L (ref 38–126)
Anion gap: 7 (ref 5–15)
BUN: 11 mg/dL (ref 6–20)
CO2: 23 mmol/L (ref 22–32)
Calcium: 9 mg/dL (ref 8.9–10.3)
Chloride: 105 mmol/L (ref 98–111)
Creatinine, Ser: 0.97 mg/dL (ref 0.44–1.00)
GFR, Estimated: >60 mL/min (ref >60)
Glucose, Bld: 83 mg/dL (ref 70–99)
Potassium: 3.4 mmol/L — ABNORMAL LOW (ref 3.5–5.1)
Sodium: 135 mmol/L (ref 135–145)
Total Bilirubin: 0.8 mg/dL (ref 0.0–1.2)
Total Protein: 7.6 g/dL (ref 6.5–8.1)

## 2024-07-16 LAB — HCG, SERUM, QUALITATIVE: Preg, Serum: NEGATIVE

## 2024-07-16 MED ORDER — GUAIFENESIN 100 MG/5ML PO LIQD
5.0000 mL | Freq: Once | ORAL | Status: AC
  Administered 2024-07-16: 5 mL via ORAL
  Filled 2024-07-16: qty 10

## 2024-07-16 MED ORDER — ACETAMINOPHEN 500 MG PO TABS
1000.0000 mg | ORAL_TABLET | Freq: Once | ORAL | Status: AC
  Administered 2024-07-16: 1000 mg via ORAL
  Filled 2024-07-16: qty 2

## 2024-07-16 NOTE — ED Triage Notes (Signed)
 Pt came in via POV d/t 2 days ago viewing a house & then discovered that house had black mold & then she began having respiratory issues. Reports she is asthmatic & thrush on her tongue &* a throbbing HA. States she is trying to breath while in water. A/Ox4, rates her pain 8/10 d/t lung discomfort from her difficulty breathing.

## 2024-07-16 NOTE — ED Provider Notes (Signed)
 [place] [facility] AT [facility] Provider Note   CSN: [id number] Arrival date & time: 07/16/24  1611     Patient presents with: Shortness of Breath   Sydney [name] is a 33 y.o. female.  {Add pertinent medical, surgical, social history, OB history to YEP:67052} The history is provided by the patient and medical records. No language interpreter was used.  Shortness of Breath    33 year old female significant history of asthma, recurrent headache, anemia presenting with complaints of trouble breathing.  For the past 2 days patient has had congestion, sneezing, coughing, headache, chills, body aches, difficulty breathing, and fatigue.  This started shortly after she went to view a house and noticed mold in it.  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  She tries taking Benadryl  and some over-the-counter medication but her symptoms progressed.  She is now complaining of headaches and bodyaches.  No nausea vomiting or diarrhea no pleuritic chest pain or hemoptysis.  She initially thought she may have thrush in her mouth.  No history of HIV.   Prior to Admission medications   Medication Sig Start Date End Date Taking? Authorizing Provider  albuterol  (VENTOLIN  HFA) 108 (90 Base) MCG/ACT inhaler Inhale 2 puffs into the lungs every 6 (six) hours as needed ([place]). 04/01/21   [provider]  fluticasone  (FLONASE ) 50 MCG/ACT nasal spray Place 1 spray into both nostrils daily as needed for allergies or rhinitis.    [provider]  HYDROcodone -acetaminophen  (NORCO) 7.5-325 MG tablet Take 1 tablet by mouth every 6 (six) hours as needed for moderate pain. 07/21/22   Jesus Oliphant, MD  Levonorgestrel  (LILETTA , 52 MG, IU) 1 Device by Intrauterine route continuous.    [provider]  ondansetron  (ZOFRAN -ODT) 4 MG disintegrating tablet Take 1 tablet (4 mg total) by mouth every 8 (eight) hours as needed  for nausea or vomiting. 07/21/22   Jesus Oliphant, MD    Allergies: Connell Maes (diagnostic), Apple, Fruit extracts, Latex, and Black walnut pollen allergy skin test    Review of Systems  Respiratory:  Positive for shortness of breath.   All other systems reviewed and are negative.   Updated Vital Signs BP 115/79 (BP Location: Left Arm)   Pulse 76   Temp 98.2 F (36.8 C)   Resp 18   Ht 5' 7 (1.702 m)   Wt 74.8 kg   SpO2 100%   BMI 25.84 kg/m   Physical Exam Vitals and nursing note reviewed.  Constitutional:      General: She is not in acute distress.    Appearance: She is well-developed.  HENT:     Head: Atraumatic.     Mouth/Throat:     Mouth: Mucous membranes are moist.     Comments: Uvula midline no tonsillar enlargement or exudates no oral thrush appreciated Eyes:     Conjunctiva/sclera: Conjunctivae normal.  Neck:     Comments: No nuchal rigidity Cardiovascular:     Rate and Rhythm: Normal rate and regular rhythm.  Pulmonary:     Effort: Pulmonary effort is normal.     Breath sounds: No wheezing, rhonchi or rales.  Abdominal:     Palpations: Abdomen is soft.     Tenderness: There is no abdominal tenderness.  Musculoskeletal:     Cervical back: Normal range of motion and neck supple. No rigidity.  Skin:    Findings: No rash.  Neurological:  Mental Status: She is alert and oriented to person, place, and time.  Psychiatric:        Mood and Affect: Mood normal.     (all labs ordered are listed, but only abnormal results are displayed) Labs Reviewed  CBC WITH DIFFERENTIAL/PLATELET - Abnormal; Notable for the following components:      Result Value   WBC 2.8 (*)    Neutro Abs 1.6 (*)    All other components within normal limits  COMPREHENSIVE METABOLIC PANEL WITH GFR - Abnormal; Notable for the following components:   Potassium 3.4 (*)    All other components within normal limits  RESP PANEL BY RT-PCR (RSV, FLU A&B, COVID)  [id number]  HCG, SERUM,  QUALITATIVE    EKG: None  Radiology: DG Chest 2 View Result Date: 07/16/2024 EXAM: 2 VIEW(S) XRAY OF THE CHEST 07/16/2024 05:09:00 PM COMPARISON: 05/12/2021 CLINICAL HISTORY: SOB. Patient states that she was exposed to black mold 2 days ago and has been having SOB and cough since then that is getting worse. The cough is sometimes dry and sometimes productive. She feels like she has to cough really hard to be able to get anything up. FINDINGS: LUNGS AND PLEURA: No focal pulmonary opacity. No pulmonary edema. No pleural effusion. No pneumothorax. HEART AND MEDIASTINUM: No acute abnormality of the cardiac and mediastinal silhouettes. BONES AND SOFT TISSUES: No acute osseous abnormality. IMPRESSION: 1. No acute process. Electronically signed by: Rockey Kilts MD 07/16/2024 05:18 PM EDT RP Workstation: [id number]    {Document cardiac monitor, telemetry assessment procedure when appropriate:32947} Procedures   Medications Ordered in the ED - No data to display    {Click here for ABCD2, HEART and other calculators REFRESH Note before signing:1}                              Medical Decision Making  BP 115/79 (BP Location: Left Arm)   Pulse 76   Temp 98.2 F (36.8 C)   Resp 18   Ht 5' 7 (1.702 m)   Wt 74.8 kg   SpO2 100%   BMI 25.84 kg/m   47:36 PM  33 year old female significant history of asthma, recurrent headache, anemia presenting with complaints of trouble breathing.  For the past 2 days patient has had congestion, sneezing, coughing, headache, chills, body aches, difficulty breathing, and fatigue.  This started shortly after she went to view a house and noticed mold in it.  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  She tries taking Benadryl  and some over-the-counter medication but her symptoms progressed.  She is now complaining of headaches and bodyaches.  No nausea vomiting or diarrhea no pleuritic chest pain or hemoptysis.  She  initially thought she may have thrush in her mouth.  No history of HIV.  On exam this is a well-appearing female resting comfortably appears to be in no acute discomfort.  Ear nose throat exam overall reassuring no evidence of thrush.  She does not exhibit any nuchal rigidity concern for meningitis.  Heart with normal rhythm, lungs are clear no wheezes no rales or rhonchi.  Abdomen is soft nontender, strength equal throughout  Vital sign overall reassuring no fever no hypoxia  -Labs ordered, independently viewed and interpreted by me.  Labs remarkable for mild leukopenia with WBC 2.8.  This could be in the setting of a viral infection in setting of HIV  however my suspicion is low for HIV.  Will defer testing at this time.  Pregnancy test is negative.  Electrolyte panels are reassuring.  Respiratory panel is pending -The patient was maintained on a cardiac monitor.  I personally viewed and interpreted the cardiac monitored which showed an underlying rhythm of: Normal sinus rhythm -Imaging independently viewed and interpreted by me and I agree with radiologist's interpretation.  Result remarkable for chest x-ray unremarkable -This patient presents to the ED for concern of shortness of breath, this involves an extensive number of treatment options, and is a complaint that carries with it a high risk of complications and morbidity.  The differential diagnosis includes COVID, flu, RSV, pneumonia, HIV, viral illness, asthma exacerbation, allergic rhinitis -Co morbidities that complicate the patient evaluation includes recurrent headache, asthma, anemia -Treatment includes Tylenol , Robitussin -Reevaluation of the patient after these medicines showed that the patient improved -PCP office notes or outside notes reviewed -Discussion with specialist *** -Escalation to admission/observation considered: patients feels much better, is comfortable with discharge, and will follow up with PCP -Prescription medication  considered, patient comfortable with *** -Social Determinant of Health considered which includes ***   {Document critical care time when appropriate  Document review of labs and clinical decision tools ie [id number], etc  Document your independent review of radiology images and any outside records  Document your discussion with family members, caretakers and with consultants  Document social determinants of health affecting pt's care  Document your decision making why or why not admission, treatments were needed:32947:::1}   Final diagnoses:  None    ED Discharge Orders     None

## 2024-07-16 NOTE — ED Provider Triage Note (Signed)
 Emergency Medicine Provider Triage Evaluation Note  Sydney [name] , a 33 y.o. female  was evaluated in triage.  Pt complains of 45 minutes of black mold exposure 2 days ago.  Endorsing shortness of breath, cough, worsening symptoms whenever she tries to take a deep breath.  She reports using her inhaler without any improvement in symptoms.  Review of Systems  Positive: Shortness of breath, cough, wheezing Negative: Fever  Physical Exam  BP 128/85 (BP Location: Right Arm)   Pulse (!) 107   Temp 99.4 F (37.4 C)   Resp (!) 23   Ht 5' 7 (1.702 m)   Wt 74.8 kg   SpO2 100%   BMI 25.84 kg/m  Gen:   Awake, no distress   Resp:  Normal effort  MSK:   Moves extremities without difficulty  Other:  Lungs are clear to auscultation, nasal congestion noted.  Medical Decision Making  Medically screening exam initiated at 4:35 PM.  Appropriate orders placed.  Suzen KATHEE Collet was informed that the remainder of the evaluation will be completed by another provider, this initial triage assessment does not replace that evaluation, and the importance of remaining in the ED until their evaluation is complete.    Savi Lastinger, PA-C 07/16/24 1637

## 2024-07-17 LAB — RESP PANEL BY RT-PCR (RSV, FLU A&B, COVID)  RVPGX2
Influenza A by PCR: NEGATIVE
Influenza B by PCR: POSITIVE — AB
Resp Syncytial Virus by PCR: NEGATIVE
SARS Coronavirus 2 by RT PCR: NEGATIVE

## 2024-07-17 MED ORDER — ACETAMINOPHEN 500 MG PO TABS: 1000.0000 mg | ORAL_TABLET | Freq: Four times a day (QID) | ORAL | 0 refills | Status: AC | PRN

## 2024-07-17 MED ORDER — OSELTAMIVIR PHOSPHATE 75 MG PO CAPS: 75.0000 mg | ORAL_CAPSULE | Freq: Two times a day (BID) | ORAL | 0 refills | Status: AC

## 2024-07-17 MED ORDER — BENZONATATE 100 MG PO CAPS: 100.0000 mg | ORAL_CAPSULE | Freq: Three times a day (TID) | ORAL | 0 refills | Status: AC

## 2024-07-17 NOTE — Discharge Instructions (Addendum)
 You have been evaluated for your symptoms.  You have tested positive for influenza B.  Please take medication prescribed, get rest, drink plenty of fluid, return if you have any concern.
